# Patient Record
Sex: Female | Born: 1995 | Race: White | Hispanic: No | Marital: Single | Smoking: Never smoker
Health system: Southern US, Community
[De-identification: ages and names within clinical notes are randomized; demographics above are authoritative.]

## PROBLEM LIST (undated history)

## (undated) DIAGNOSIS — E0500 Thyrotoxicosis with diffuse goiter without thyrotoxic crisis or storm: Secondary | ICD-10-CM

## (undated) HISTORY — PX: ADENOIDECTOMY: SUR15

## (undated) HISTORY — PX: TONSILLECTOMY: SUR1361

---

## 2020-08-12 ENCOUNTER — Other Ambulatory Visit: Payer: Self-pay

## 2020-08-12 ENCOUNTER — Ambulatory Visit
Admission: EM | Admit: 2020-08-12 | Discharge: 2020-08-12 | Disposition: A | Discharge status: Home / Self Care | Payer: PRIVATE HEALTH INSURANCE | Attending: Emergency Medicine | Admitting: Emergency Medicine

## 2020-08-12 DIAGNOSIS — Z1152 Encounter for screening for COVID-19: Secondary | ICD-10-CM | POA: Diagnosis not present

## 2020-08-12 DIAGNOSIS — J029 Acute pharyngitis, unspecified: Secondary | ICD-10-CM | POA: Insufficient documentation

## 2020-08-12 LAB — POCT RAPID STREP A (OFFICE): Rapid Strep A Screen: NEGATIVE

## 2020-08-12 MED ORDER — PREDNISONE 10 MG PO TABS: 20.0000 mg | ORAL_TABLET | Freq: Every day | ORAL | 0 refills | Status: DC

## 2020-08-12 NOTE — Discharge Instructions (Addendum)
COVID-19 test will take 2 to 7 days for results to return someone will call you if your result is positive  Strep test negative, will send out for culture and we will call you with results Get plenty of rest and push fluids Continue to take Flonase and Zyrtec and Cepacol lozenges Prednisone was prescribed Take OTC ibuprofen or tylenol as needed for pain Follow up with PCP if symptoms persists Return or go to ER if patient has any new or worsening symptoms such as fever, chills, nausea, vomiting, worsening sore throat, cough, abdominal pain, chest pain, changes in bowel or bladder habits, etc..Marland Kitchen

## 2020-08-12 NOTE — ED Provider Notes (Signed)
[facility]   [id number] 08/12/20 Arrival Time: 1730  TK:[id number] THROAT  SUBJECTIVE: History from: patient.  Paula Holden is a 24 y.o. female who presents with abrupt onset of sore throat for the past 3 days.  Denies sick exposure to strep, flu or mono, or precipitating event.  Has tried OTC medication without relief.  Symptoms are made worse with swallowing, but tolerating liquids and own secretions without difficulty.  R denies previous symptoms in the past.   Denies fever, chills, fatigue, ear pain, sinus pain, rhinorrhea, nasal congestion, cough, SOB, wheezing, chest pain, nausea, rash, changes in bowel or bladder habits.     ROS: As per HPI.  All other pertinent ROS negative.     History reviewed. No pertinent past medical history. Past Surgical History:  Procedure Laterality Date   TONSILLECTOMY     Allergies  Allergen Reactions   Augmentin [Amoxicillin-Pot Clavulanate] Nausea And Vomiting   No current facility-administered medications on file prior to encounter.   Current Outpatient Medications on File Prior to Encounter  Medication Sig Dispense Refill   TRI-ESTARYLLA 0.18/0.215/0.25 MG-35 MCG tablet Take 1 tablet by mouth daily.     Social History   Socioeconomic History   Marital status: Single    Spouse name: Not on file   Number of children: Not on file   Years of education: Not on file   Highest education level: Not on file  Occupational History   Not on file  Tobacco Use   Smoking status: Never Smoker   Smokeless tobacco: Never Used  Substance and Sexual Activity   Alcohol use: Yes   Drug use: Not on file   Sexual activity: Not on file  Other Topics Concern   Not on file  Social History Narrative   Not on file   Social Determinants of Health   Financial Resource Strain:    Difficulty of Paying Living Expenses: Not on file  Food Insecurity:    Worried About Running Out of Food in the Last Year: Not on file   Ran Out of  Food in the Last Year: Not on file  Transportation Needs:    Lack of Transportation (Medical): Not on file   Lack of Transportation (Non-Medical): Not on file  Physical Activity:    Days of Exercise per Week: Not on file   Minutes of Exercise per Session: Not on file  Stress:    Feeling of Stress : Not on file  Social Connections:    Frequency of Communication with Friends and Family: Not on file   Frequency of Social Gatherings with Friends and Family: Not on file   Attends Religious Services: Not on file   Active Member of Clubs or Organizations: Not on file   Attends Banker Meetings: Not on file   Marital Status: Not on file  Intimate Partner Violence:    Fear of Current or Ex-Partner: Not on file   Emotionally Abused: Not on file   Physically Abused: Not on file   Sexually Abused: Not on file   History reviewed. No pertinent family history.  OBJECTIVE:  Vitals:   08/12/20 1845  BP: 128/86  Pulse: 73  Resp: 18  Temp: 98 F (36.7 C)  SpO2: 99%     General appearance: alert; appears fatigued, but nontoxic, speaking in full sentences and managing own secretions HEENT: NCAT; Ears: EACs clear, TMs pearly gray with visible cone of light, without erythema; Eyes: PERRL, EOMI grossly; Nose: no obvious rhinorrhea; Throat:  oropharynx clear, tonsils 1+ and mildly erythematous without white tonsillar exudates, uvula midline Neck: supple without LAD Lungs: CTA bilaterally without adventitious breath sounds; cough absent Heart: regular rate and rhythm.  Radial pulses 2+ symmetrical bilaterally Skin: warm and dry Psychological: alert and cooperative; normal mood and affect  LABS: Results for orders placed or performed during the hospital encounter of 08/12/20 (from the past 24 hour(s))  POCT rapid strep A     Status: None   Collection Time: 08/12/20  7:00 PM  Result Value Ref Range   Rapid Strep A Screen Negative Negative     ASSESSMENT &  PLAN:  1. Sore throat   2. Encounter for screening for COVID-19     Meds ordered this encounter  Medications   predniSONE (DELTASONE) 10 MG tablet    Sig: Take 2 tablets (20 mg total) by mouth daily.    Dispense:  15 tablet    Refill:  0    Discharge instructions  COVID-19 test will take 2 to 7 days for results to return someone will call you if your result is positive  Strep test negative, will send out for culture and we will call you with results Get plenty of rest and push fluids Continue to take Flonase and Zyrtec and Cepacol lozenges Prednisone was prescribed Take OTC ibuprofen or tylenol as needed for pain Follow up with PCP if symptoms persists Return or go to ER if patient has any new or worsening symptoms such as fever, chills, nausea, vomiting, worsening sore throat, cough, abdominal pain, chest pain, changes in bowel or bladder habits, etc...  Reviewed expectations re: course of current medical issues. Questions answered. Outlined signs and symptoms indicating need for more acute intervention. Patient verbalized understanding. After Visit Summary given.     Note: This document was prepared using Dragon voice recognition software and may include unintentional dictation errors.    Durward Parcel, FNP 08/12/20 1913

## 2020-08-12 NOTE — ED Triage Notes (Signed)
Pt presents with c/o sore throat for past 3 days

## 2020-08-13 ENCOUNTER — Telehealth: Payer: Self-pay | Admitting: Emergency Medicine

## 2020-08-13 LAB — NOVEL CORONAVIRUS, NAA

## 2020-08-13 NOTE — Telephone Encounter (Signed)
Received a call from [facility] that patient's sample will not be run due to it being mislabeled with another patient's name.  Attempted to call patient to let her know, LVM.  Asked her to return call to me, or to the location where she was seen.

## 2020-08-14 ENCOUNTER — Ambulatory Visit
Admission: EM | Admit: 2020-08-14 | Discharge: 2020-08-14 | Disposition: A | Discharge status: Home / Self Care | Payer: PRIVATE HEALTH INSURANCE | Attending: Emergency Medicine | Admitting: Emergency Medicine

## 2020-08-14 ENCOUNTER — Other Ambulatory Visit: Payer: Self-pay

## 2020-08-14 ENCOUNTER — Telehealth: Payer: Self-pay | Admitting: Emergency Medicine

## 2020-08-14 DIAGNOSIS — Z1152 Encounter for screening for COVID-19: Secondary | ICD-10-CM

## 2020-08-14 MED ORDER — LIDOCAINE VISCOUS HCL 2 % MT SOLN: 15.0000 mL | OROMUCOSAL | 0 refills | Status: DC | PRN

## 2020-08-14 NOTE — ED Triage Notes (Signed)
Pt here for re collection of covid test

## 2020-08-14 NOTE — Telephone Encounter (Signed)
Sent to [facility]

## 2020-08-15 LAB — CULTURE, GROUP A STREP (THRC)

## 2020-08-15 LAB — NOVEL CORONAVIRUS, NAA: SARS-CoV-2, NAA: NOT DETECTED

## 2021-03-04 LAB — TSH: TSH: 0.15 — AB (ref 0.41–5.90)

## 2021-03-09 ENCOUNTER — Encounter: Payer: Self-pay | Admitting: Nurse Practitioner

## 2021-03-09 ENCOUNTER — Ambulatory Visit (INDEPENDENT_AMBULATORY_CARE_PROVIDER_SITE_OTHER): Payer: 59 | Admitting: Nurse Practitioner

## 2021-03-09 ENCOUNTER — Other Ambulatory Visit: Payer: Self-pay

## 2021-03-09 VITALS — BP 131/85 | HR 93 | Ht 68.0 in | Wt 175.0 lb

## 2021-03-09 DIAGNOSIS — R7989 Other specified abnormal findings of blood chemistry: Secondary | ICD-10-CM | POA: Diagnosis not present

## 2021-03-09 NOTE — Progress Notes (Signed)
03/09/2021     Endocrinology Consult Note    Subjective:    Patient ID: Paula Holden, female    DOB: [birth year], PCP Paula Chime, Paula Holden.   History reviewed. No pertinent past medical history.  Past Surgical History:  Procedure Laterality Date  . ADENOIDECTOMY    . TONSILLECTOMY      Social History   Socioeconomic History  . Marital status: Single    Spouse name: Not on file  . Number of children: Not on file  . Years of education: Not on file  . Highest education level: Not on file  Occupational History  . Not on file  Tobacco Use  . Smoking status: Never Smoker  . Smokeless tobacco: Never Used  Vaping Use  . Vaping Use: Never used  Substance and Sexual Activity  . Alcohol use: Yes    Comment: Social  . Drug use: Never  . Sexual activity: Not on file  Other Topics Concern  . Not on file  Social History Narrative  . Not on file   Social Determinants of Health   Financial Resource Strain: Not on file  Food Insecurity: Not on file  Transportation Needs: Not on file  Physical Activity: Not on file  Stress: Not on file  Social Connections: Not on file    Family History  Problem Relation Age of Onset  . Hyperlipidemia Mother   . Thyroid disease Maternal Grandmother     Outpatient Encounter Medications as of 03/09/2021  Medication Sig  . hydrOXYzine (ATARAX/VISTARIL) 10 MG tablet Take by mouth daily.  . Norgestimate-Ethinyl Estradiol Triphasic (ORTHO TRI-CYCLEN LO) 0.18/0.215/0.25 MG-25 MCG tab Take 1 tablet by mouth daily.  Marland Kitchen buPROPion (WELLBUTRIN SR) 100 MG 12 hr tablet Take 100 mg by mouth every morning.  . busPIRone (BUSPAR) 10 MG tablet Take 10 mg by mouth 2 (two) times daily.  Marland Kitchen ibuprofen (ADVIL) 800 MG tablet Take 800 mg by mouth 2 (two) times daily as needed.  . sertraline (ZOLOFT) 50 MG tablet Take 50 mg by mouth daily.  . [DISCONTINUED] hydrOXYzine (ATARAX/VISTARIL) 10 MG tablet Take 10-20 mg by mouth at bedtime.  . [DISCONTINUED]  lidocaine (XYLOCAINE) 2 % solution Use as directed 15 mLs in the mouth or throat as needed for mouth pain (Do NOT exceed 8 doses in a 24 hour period).  . [DISCONTINUED] predniSONE (DELTASONE) 10 MG tablet Take 2 tablets (20 mg total) by mouth daily.  . [DISCONTINUED] sertraline (ZOLOFT) 25 MG tablet Take 25 mg by mouth daily.  . [DISCONTINUED] TRI-ESTARYLLA 0.18/0.215/0.25 MG-35 MCG tablet Take 1 tablet by mouth daily.   No facility-administered encounter medications on file as of 03/09/2021.    ALLERGIES: Allergies  Allergen Reactions  . Augmentin [Amoxicillin-Pot Clavulanate] Nausea And Vomiting  . Prednisone Anxiety    VACCINATION STATUS:  There is no immunization history on file for this patient.   HPI Thyroid Problem Presents for initial visit. The condition has lasted for 3 months. Symptoms include anxiety, fatigue, palpitations and tremors. The symptoms have been stable. Past treatments include nothing. Risk factors include family history of hyperthyroidism and family history of hypothyroidism.     Paula Holden is 25 y.o. female who presents today with a medical history as above. she is being seen in consultation for hyperthyroidism requested by Paula Chime, Paula Holden.  she has been dealing with symptoms of anxiety, fatigue, palpitations, headache, near syncope, insomnia, throat tenderness, and tremors for 2-3 months. These symptoms are progressively worsening and  troubling to her.  her most recent thyroid labs revealed suppressed TSH of 0.145 and elevated FT4 of 2.4 on 03/04/21. she denies dysphagia, choking, shortness of breath, no recent voice change.    she does have family history of thyroid dysfunction in her cousin (Graves disease), maternal grandmother (Hypo), and great aunt (unknown), but denies family hx of thyroid cancer. she denies personal history of goiter. she is not on any anti-thyroid medications nor on any thyroid hormone supplements. She does endorse use of Biotin  containing supplements.  she is willing to proceed with appropriate work up and therapy for thyrotoxicosis.   Review of systems  Constitutional: + Minimally fluctuating body weight, current Body mass index is 26.61 kg/m., + fatigue, no subjective hyperthermia, no subjective hypothermia Eyes: no blurry vision, no xerophthalmia ENT: + sore throat (improving), no nodules palpated in throat, no dysphagia/odynophagia, no hoarseness Cardiovascular: no chest pain, no shortness of breath, + palpitations, no leg swelling Respiratory: no cough, no shortness of breath Gastrointestinal: no nausea/vomiting/diarrhea Musculoskeletal: no muscle/joint aches Skin: no rashes, no hyperemia Neurological: + tremors, no numbness, no tingling, no dizziness Psychiatric: no depression, + anxiety   Objective:    BP 131/85   Pulse 93   Ht 5\' 8"  (1.727 m)   Wt 175 lb (79.4 kg)   BMI 26.61 kg/m   Wt Readings from Last 3 Encounters:  03/09/21 175 lb (79.4 kg)     BP Readings from Last 3 Encounters:  03/09/21 131/85  08/12/20 128/86                          Physical Exam- Limited  Constitutional:  Body mass index is 26.61 kg/m. , not in acute distress, anxious state of mind Eyes:  EOMI, no exophthalmos Neck: Supple, visible fullness to anterior neck Thyroid: + gross goiter, tenderness to palpation R>L Cardiovascular: RRR, no murmers, rubs, or gallops, no edema Respiratory: Adequate breathing efforts, no crackles, rales, rhonchi, or wheezing Musculoskeletal: no gross deformities, strength intact in all four extremities, no gross restriction of joint movements Skin:  no rashes, no hyperemia Neurological: + tremor with outstretched hands, deep tendon reflexes normal BLE   CMP  No results found for: NA, K, CL, CO2, GLUCOSE, BUN, CREATININE, CALCIUM, PROT, ALBUMIN, AST, ALT, ALKPHOS, BILITOT, GFRNONAA, GFRAA   CBC No results found for: WBC, RBC, HGB, HCT, PLT, MCV, MCH, MCHC, RDW, LYMPHSABS,  MONOABS, EOSABS, BASOSABS   Diabetic Labs (most recent): No results found for: HGBA1C  Lipid Panel  No results found for: CHOL, TRIG, HDL, CHOLHDL, VLDL, LDLCALC, LDLDIRECT, LABVLDL   Lab Results  Component Value Date   TSH 0.15 (A) 03/04/2021        Assessment & Plan:   1. Abnormal TSH- suspect hyperthyroidism r/t Graves Disease  she is being seen at a kind request of [facility], 03/06/2021, Paula Holden.  her history and most recent labs are reviewed, and she was examined clinically. Subjective and objective findings are consistent with thyrotoxicosis likely from primary hyperthyroidism. The potential risks of untreated thyrotoxicosis and the need for definitive therapy have been discussed in detail with her, and she agrees to proceed with diagnostic workup and treatment plan.   I will repeat full profile thyroid function tests after she has been off Biotin supplement for approximately 1 week, and confirmatory thyroid uptake and scan will be scheduled to be done as soon as possible.   Options of therapy are discussed with her.  We  discussed the option of treating it with medications including methimazole or PTU which may have side effects including rash, transaminitis, and bone marrow suppression.  We also discussed the option of definitive therapy with RAI ablation of the thyroid. If she is found to have primary hyperthyroidism from Graves' disease , toxic multinodular goiter or toxic nodular goiter the preferred modality of treatment would be I-131 thyroid ablation. Surgery is another choice of treatment in some cases, in her case surgery is not a good fit for presentation with only mild goiter.  -Patient is made aware of the high likelihood of post ablative hypothyroidism with subsequent need for lifelong thyroid hormone replacement. sheunderstands this outcome and she is  willing to proceed.      she will return in 10 days for treatment decision.  Given her stable symptoms and stable HRof 93  today, therefore no need for beta blocker today.  She has allergy to prednisone.    -Patient is advised to maintain close follow up with Paula Chime, Paula Holden for primary care needs.   - Time spent with the patient: 60 minutes, of which >50% was spent in obtaining information about her symptoms, reviewing her previous labs, evaluations, and treatments, counseling her about her hyperthyroidism , and developing a plan to confirm the diagnosis and long term treatment as necessary. Please refer to "Patient Self Inventory" in the Media tab for reviewed elements of pertinent patient history.  [facility] participated in the discussions, expressed understanding, and voiced agreement with the above plans.  All questions were answered to her satisfaction. she is encouraged to contact clinic should she have any questions or concerns prior to her return visit.   Follow up plan: Return in about 10 days (around 03/19/2021) for Thyroid follow up, Previsit labs, uptake and scan.   Thank you for involving me in the care of this pleasant patient, and I will continue to update you with her progress.  Ronny Bacon, [facility] [address] [place], [place] [address] Phone: [phone] Fax: [phone]  03/09/2021, 9:44 AM

## 2021-03-09 NOTE — Patient Instructions (Signed)
Thyroid-Stimulating Hormone Test Why am I having this test? The thyroid is a gland in the lower front of the neck. It makes hormones that affect many body parts and systems, including the system that affects how quickly the body burns fuel for energy (metabolism). The pituitary gland is located just below the brain, behind the eyes and nasal passages. It helps maintain thyroid hormone levels and thyroid gland function. You may have a thyroid-stimulating hormone (TSH) test if you have possible symptoms of abnormal thyroid hormone levels. This test can help your health care provider:  Diagnose a disorder of the thyroid gland or pituitary gland.  Manage your condition and treatment if you have an underactive thyroid (hypothyroidism) or an overactive thyroid (hyperthyroidism). Newborn babies may have this test done to screen for hypothyroidism that is present at birth (congenital). What is being tested? This test measures the amount of TSH in your blood. TSH may also be called thyrotropin. When the thyroid does not make enough hormones, the pituitary gland releases TSH into the bloodstream to stimulate the thyroid gland to make more hormones. What kind of sample is taken? A blood sample is required for this test. It is usually collected by inserting a needle into a blood vessel. For newborns, a small amount of blood may be collected from the umbilical cord, or by using a small needle to prick the baby's heel (heel stick).      Tell a health care provider about:  All medicines you are taking, including vitamins, herbs, eye drops, creams, and over-the-counter medicines.  Any blood disorders you have.  Any surgeries you have had.  Any medical conditions you have.  Whether you are pregnant or may be pregnant. How are the results reported? Your test results will be reported as a value that indicates how much TSH is in your blood. Your health care provider will compare your results to normal ranges  that were established after testing a large group of people (reference ranges). Reference ranges may vary among labs and hospitals. For this test, common reference ranges are:  Adult: 2-10 microunits/mL or 2-10 milliunits/L.  Newborn: ? Heel stick: 3-18 microunits/mL or 3-18 milliunits/L. ? Umbilical cord: 3-12 microunits/mL or 3-12 milliunits/L. What do the results mean? Results that are within the reference range are considered normal. This means that you have a normal amount of TSH in your blood. Results that are higher than the reference range mean that your TSH levels are too high. This may mean:  Your thyroid gland is not making enough thyroid hormones.  Your thyroid medicine dosage is too low.  You have a tumor on your pituitary gland. This is rare. Results that are lower than the reference range mean that your TSH levels are too low. This may be caused by hyperthyroidism or by a problem with the pituitary gland function. Talk with your health care provider about what your results mean. Questions to ask your health care provider Ask your health care provider, or the department that is doing the test:  When will my results be ready?  How will I get my results?  What are my treatment options?  What other tests do I need?  What are my next steps? Summary  You may have a thyroid-stimulating hormone (TSH) test if you have possible symptoms of abnormal thyroid hormone levels.  The thyroid is a gland in the lower front of the neck. It makes hormones that affect many body parts and systems.  The pituitary gland is   located just below the brain, behind the eyes and nasal passages. It helps maintain thyroid hormone levels and thyroid gland function.  This test measures the amount of TSH in your blood. TSH is made by the pituitary gland. It may also be called thyrotropin. This information is not intended to replace advice given to you by your health care provider. Make sure you  discuss any questions you have with your health care provider. Document Revised: 08/13/2020 Document Reviewed: 08/13/2020 Elsevier Patient Education  2021 [employer]  

## 2021-03-18 ENCOUNTER — Encounter (HOSPITAL_COMMUNITY)
Admission: RE | Admit: 2021-03-18 | Discharge: 2021-03-18 | Disposition: A | Discharge status: Home / Self Care | Payer: 59 | Source: Ambulatory Visit | Attending: Nurse Practitioner | Admitting: Nurse Practitioner

## 2021-03-18 ENCOUNTER — Encounter (HOSPITAL_COMMUNITY): Payer: Self-pay

## 2021-03-18 DIAGNOSIS — R7989 Other specified abnormal findings of blood chemistry: Secondary | ICD-10-CM | POA: Insufficient documentation

## 2021-03-18 MED ORDER — SODIUM IODIDE I-123 7.4 MBQ CAPS
304.0000 | ORAL_CAPSULE | Freq: Once | ORAL | Status: AC
  Administered 2021-03-18: 304 via ORAL

## 2021-03-19 ENCOUNTER — Encounter (HOSPITAL_COMMUNITY)
Admission: RE | Admit: 2021-03-19 | Discharge: 2021-03-19 | Disposition: A | Discharge status: Home / Self Care | Payer: 59 | Source: Ambulatory Visit | Attending: Nurse Practitioner | Admitting: Nurse Practitioner

## 2021-03-20 LAB — T4, FREE: Free T4: 0.99 ng/dL (ref 0.82–1.77)

## 2021-03-20 LAB — TSH: TSH: 2.26 u[IU]/mL (ref 0.450–4.500)

## 2021-03-20 LAB — THYROGLOBULIN ANTIBODY: Thyroglobulin Antibody: 3.8 IU/mL — ABNORMAL HIGH (ref 0.0–0.9)

## 2021-03-20 LAB — THYROID PEROXIDASE ANTIBODY: Thyroperoxidase Ab SerPl-aCnc: 35 IU/mL — ABNORMAL HIGH (ref 0–34)

## 2021-03-22 NOTE — Patient Instructions (Signed)

## 2021-03-23 ENCOUNTER — Ambulatory Visit (INDEPENDENT_AMBULATORY_CARE_PROVIDER_SITE_OTHER): Payer: 59 | Admitting: Nurse Practitioner

## 2021-03-23 ENCOUNTER — Other Ambulatory Visit: Payer: Self-pay

## 2021-03-23 ENCOUNTER — Encounter: Payer: Self-pay | Admitting: Nurse Practitioner

## 2021-03-23 VITALS — BP 112/79 | HR 92 | Ht 68.0 in | Wt 178.6 lb

## 2021-03-23 DIAGNOSIS — E0590 Thyrotoxicosis, unspecified without thyrotoxic crisis or storm: Secondary | ICD-10-CM

## 2021-03-23 DIAGNOSIS — E0500 Thyrotoxicosis with diffuse goiter without thyrotoxic crisis or storm: Secondary | ICD-10-CM

## 2021-03-23 NOTE — Progress Notes (Signed)
03/23/2021     Endocrinology Follow Up Note    Subjective:    Patient ID: Paula Holden, female    DOB: 08-06-1996, PCP Lenoria Chime, FNP.   History reviewed. No pertinent past medical history.  Past Surgical History:  Procedure Laterality Date  . ADENOIDECTOMY    . TONSILLECTOMY      Social History   Socioeconomic History  . Marital status: Single    Spouse name: Not on file  . Number of children: Not on file  . Years of education: Not on file  . Highest education level: Not on file  Occupational History  . Not on file  Tobacco Use  . Smoking status: Never Smoker  . Smokeless tobacco: Never Used  Vaping Use  . Vaping Use: Never used  Substance and Sexual Activity  . Alcohol use: Yes    Comment: Social  . Drug use: Never  . Sexual activity: Not on file  Other Topics Concern  . Not on file  Social History Narrative  . Not on file   Social Determinants of Health   Financial Resource Strain: Not on file  Food Insecurity: Not on file  Transportation Needs: Not on file  Physical Activity: Not on file  Stress: Not on file  Social Connections: Not on file    Family History  Problem Relation Age of Onset  . Hyperlipidemia Mother   . Thyroid disease Maternal Grandmother     Outpatient Encounter Medications as of 03/23/2021  Medication Sig  . buPROPion (WELLBUTRIN SR) 100 MG 12 hr tablet Take 100 mg by mouth every morning.  . busPIRone (BUSPAR) 10 MG tablet Take 10 mg by mouth 2 (two) times daily.  . fluticasone (FLONASE) 50 MCG/ACT nasal spray Place into both nostrils.  . hydrOXYzine (ATARAX/VISTARIL) 10 MG tablet Take 20 mg by mouth at bedtime.  Marland Kitchen ibuprofen (ADVIL) 800 MG tablet Take 800 mg by mouth 2 (two) times daily as needed.  . loratadine (CLARITIN) 10 MG tablet Take 10 mg by mouth daily.  . montelukast (SINGULAIR) 10 MG tablet Take 1 tablet by mouth daily.  . Norgestimate-Ethinyl Estradiol Triphasic 0.18/0.215/0.25 MG-25 MCG tab Take 1  tablet by mouth daily.  . sertraline (ZOLOFT) 50 MG tablet Take 50 mg by mouth daily.   No facility-administered encounter medications on file as of 03/23/2021.    ALLERGIES: Allergies  Allergen Reactions  . Augmentin [Amoxicillin-Pot Clavulanate] Nausea And Vomiting  . Prednisone Anxiety    VACCINATION STATUS:  There is no immunization history on file for this patient.   HPI Thyroid Problem Presents for follow-up visit. The condition has lasted for 3 months. Symptoms include anxiety, fatigue, palpitations and tremors. The symptoms have been stable. Past treatments include nothing. Risk factors include family history of hyperthyroidism and family history of hypothyroidism.     Paula Holden is 25 y.o. female who presents today with a medical history as above. she is being seen in follow up after being seen in consultation for hyperthyroidism requested by Lenoria Chime, FNP.  she has been dealing with symptoms of anxiety, fatigue, palpitations, headache, near syncope, insomnia, throat tenderness, and tremors for 2-3 months. These symptoms are progressively worsening and troubling to her.  her most recent thyroid labs revealed suppressed TSH of 0.145 and elevated FT4 of 2.4 on 03/04/21. she denies dysphagia, choking, shortness of breath, no recent voice change.    she does have family history of thyroid dysfunction in her cousin (Graves disease),  maternal grandmother (Hypo), and great aunt (unknown), but denies family hx of thyroid cancer. she denies personal history of goiter. she is not on any anti-thyroid medications nor on any thyroid hormone supplements. She does endorse use of Biotin containing supplements.  she is willing to proceed with appropriate work up and therapy for thyrotoxicosis.   Review of systems  Constitutional: + Minimally fluctuating body weight, current Body mass index is 27.16 kg/m., + fatigue, no subjective hyperthermia, no subjective hypothermia Eyes: no  blurry vision, no xerophthalmia ENT: + sore throat (improving), no nodules palpated in throat, no dysphagia/odynophagia, no hoarseness Cardiovascular: no chest pain, no shortness of breath, + palpitations, no leg swelling Respiratory: no cough, no shortness of breath Gastrointestinal: no nausea/vomiting/diarrhea Musculoskeletal: no muscle/joint aches Skin: no rashes, no hyperemia Neurological: + tremors, no numbness, no tingling, no dizziness Psychiatric: no depression, + anxiety   Objective:    BP 112/79 (BP Location: Left Arm, Patient Position: Sitting)   Pulse 92   Ht 5\' 8"  (1.727 m)   Wt 178 lb 9.6 oz (81 kg)   LMP 03/13/2021 (Exact Date)   BMI 27.16 kg/m   Wt Readings from Last 3 Encounters:  03/23/21 178 lb 9.6 oz (81 kg)  03/09/21 175 lb (79.4 kg)     BP Readings from Last 3 Encounters:  03/23/21 112/79  03/09/21 131/85  08/12/20 128/86                        Physical Exam- Limited  Constitutional:  There is no height or weight on file to calculate BMI. , not in acute distress, anxious state of mind Eyes:  EOMI, no exophthalmos Neck: Supple, visible fullness to anterior neck Thyroid: + gross goiter Cardiovascular: RRR, no murmers, rubs, or gallops, no edema Respiratory: Adequate breathing efforts, no crackles, rales, rhonchi, or wheezing Musculoskeletal: no gross deformities, strength intact in all four extremities, no gross restriction of joint movements Skin:  no rashes, no hyperemia Neurological: + tremor with outstretched hands, deep tendon reflexes normal BLE   CMP  No results found for: NA, K, CL, CO2, GLUCOSE, BUN, CREATININE, CALCIUM, PROT, ALBUMIN, AST, ALT, ALKPHOS, BILITOT, GFRNONAA, GFRAA   CBC No results found for: WBC, RBC, HGB, HCT, PLT, MCV, MCH, MCHC, RDW, LYMPHSABS, MONOABS, EOSABS, BASOSABS   Diabetic Labs (most recent): No results found for: HGBA1C  Lipid Panel  No results found for: CHOL, TRIG, HDL, CHOLHDL, VLDL, LDLCALC,  LDLDIRECT, LABVLDL   Lab Results  Component Value Date   TSH 2.260 03/19/2021   TSH 0.15 (A) 03/04/2021   FREET4 0.99 03/19/2021     Results for ANETRA, CZERWINSKI (MRN Paula Holden) as of 03/23/2021 08:19  Ref. Range 03/04/2021 00:00 03/19/2021 09:03 03/19/2021 09:32  TSH Latest Ref Range: 0.450 - 4.500 uIU/mL 0.15 (A)  2.260  T4,Free(Direct) Latest Ref Range: 0.82 - 1.77 ng/dL   05/19/2021  Thyroperoxidase Ab SerPl-aCnc Latest Ref Range: 0 - 34 IU/mL   35 (H)  Thyroglobulin Antibody Latest Ref Range: 0.0 - 0.9 IU/mL   3.8 (H)   Uptake and scan 03/19/21  CLINICAL DATA:  Suppressed TSH suspicious for hyperthyroidism, neck swelling and tenderness, difficulty swallowing, heat intolerance, difficulty sleeping, hand tremors, irritability and mood venous, increased perspiration, heart palpitations, weakness, dry skin  EXAM: THYROID SCAN AND UPTAKE - 4 AND 24 HOURS  TECHNIQUE: Following oral administration of I-123 capsule, anterior planar imaging was acquired at 24 hours. Thyroid uptake was calculated with a thyroid probe at  4-6 hours and 24 hours.  RADIOPHARMACEUTICALS:  304 uCi I-123 sodium iodide p.o.  COMPARISON:  None  FINDINGS: Homogeneous tracer distribution in both thyroid lobes.  No focal areas of increased or decreased tracer localization seen.  4 hour I-123 uptake = 23.5% (normal 5-20%)  24 hour I-123 uptake = 43.4% (normal 10-30%)  IMPRESSION: Normal thyroid scan.  Elevated 4 hour and 24 hour radio iodine uptakes consistent with hyperthyroidism.  Overall findings consistent with Graves disease.   Electronically Signed   By: Ulyses Southward M.D.   On: 03/19/2021 12:56  Assessment & Plan:   1. Hyperthyroidism r/t Graves Disease:   she is being seen at a kind request of White, Raenette Rover, FNP.  Her repeat thyroid labs show positive TPO and thyroglobulin antibodies, indicating autoimmune etiology.  Her uptake and scan was also suggestive of Graves disease with  4-hr uptake 23.5% and 24-hr uptake of 43.4%.  - We discussed the preferred treatment of definitive therapy with RAI ablation of the thyroid.  Patient is made aware of the high likelihood of post ablative hypothyroidism with subsequent need for lifelong thyroid hormone replacement. she understands this outcome and she is willing to proceed.      -Will recheck thyroid hormones 6-8 weeks post RAI therapy to assess response to treatment and to help indicate when appropriate to start thyroid hormone replacement therapy.    -Patient is advised to maintain close follow up with Lenoria Chime, FNP for primary care needs.    I spent 30 minutes in the care of the patient today including review of labs from Thyroid Function, CMP, and other relevant labs ; imaging/biopsy records (current and previous including abstractions from other facilities); face-to-face time discussing  her lab results and symptoms, medications doses, her options of short and long term treatment based on the latest standards of care / guidelines;   and documenting the encounter.  [facility] participated in the discussions, expressed understanding, and voiced agreement with the above plans.  All questions were answered to her satisfaction. she is encouraged to contact clinic should she have any questions or concerns prior to her return visit.    Follow up plan: Return in about 7 weeks (around 05/11/2021) for Thyroid follow up, Previsit labs- RAI ablation.   Thank you for involving me in the care of this pleasant patient, and I will continue to update you with her progress.  Ronny Bacon, [facility] [address] [place], [place] [address] Phone: [phone] Fax: [phone]  03/23/2021, 9:37 AM

## 2021-04-01 ENCOUNTER — Encounter (HOSPITAL_COMMUNITY)
Admission: RE | Admit: 2021-04-01 | Discharge: 2021-04-01 | Disposition: A | Discharge status: Home / Self Care | Payer: 59 | Source: Ambulatory Visit | Attending: Nurse Practitioner | Admitting: Nurse Practitioner

## 2021-04-01 ENCOUNTER — Encounter (HOSPITAL_COMMUNITY): Payer: Self-pay

## 2021-04-01 ENCOUNTER — Other Ambulatory Visit (HOSPITAL_COMMUNITY)
Admission: RE | Admit: 2021-04-01 | Discharge: 2021-04-01 | Disposition: A | Discharge status: Home / Self Care | Payer: 59 | Source: Ambulatory Visit | Attending: Nurse Practitioner | Admitting: Nurse Practitioner

## 2021-04-01 ENCOUNTER — Other Ambulatory Visit: Payer: Self-pay

## 2021-04-01 DIAGNOSIS — E0500 Thyrotoxicosis with diffuse goiter without thyrotoxic crisis or storm: Secondary | ICD-10-CM | POA: Insufficient documentation

## 2021-04-01 LAB — HCG, SERUM, QUALITATIVE: Preg, Serum: NEGATIVE

## 2021-04-01 LAB — TSH: TSH: 2.595 u[IU]/mL (ref 0.350–4.500)

## 2021-04-01 LAB — T4, FREE: Free T4: 0.66 ng/dL (ref 0.61–1.12)

## 2021-04-01 MED ORDER — SODIUM IODIDE I 131 CAPSULE
18.0000 | Freq: Once | INTRAVENOUS | Status: AC | PRN
  Administered 2021-04-01: 18.3 via ORAL

## 2021-05-05 ENCOUNTER — Other Ambulatory Visit: Payer: Self-pay

## 2021-05-05 DIAGNOSIS — E0590 Thyrotoxicosis, unspecified without thyrotoxic crisis or storm: Secondary | ICD-10-CM

## 2021-05-05 NOTE — Progress Notes (Signed)
TS

## 2021-05-08 LAB — TSH: TSH: 0.267 u[IU]/mL — ABNORMAL LOW (ref 0.450–4.500)

## 2021-05-08 LAB — T4, FREE: Free T4: 1.05 ng/dL (ref 0.82–1.77)

## 2021-05-12 ENCOUNTER — Telehealth: Payer: 59 | Admitting: Nurse Practitioner

## 2021-05-12 DIAGNOSIS — E0500 Thyrotoxicosis with diffuse goiter without thyrotoxic crisis or storm: Secondary | ICD-10-CM

## 2021-05-12 DIAGNOSIS — Z923 Personal history of irradiation: Secondary | ICD-10-CM

## 2021-05-17 ENCOUNTER — Telehealth (INDEPENDENT_AMBULATORY_CARE_PROVIDER_SITE_OTHER): Payer: 59 | Admitting: Nurse Practitioner

## 2021-05-17 ENCOUNTER — Encounter: Payer: Self-pay | Admitting: Nurse Practitioner

## 2021-05-17 DIAGNOSIS — E0500 Thyrotoxicosis with diffuse goiter without thyrotoxic crisis or storm: Secondary | ICD-10-CM | POA: Diagnosis not present

## 2021-05-17 DIAGNOSIS — E0590 Thyrotoxicosis, unspecified without thyrotoxic crisis or storm: Secondary | ICD-10-CM | POA: Diagnosis not present

## 2021-05-17 NOTE — Progress Notes (Signed)
05/17/2021     Endocrinology Follow Up Note    TELEHEALTH VISIT: The patient is being engaged in telehealth visit due to COVID-19.  This type of visit limits physical examination significantly, and thus is not preferable over face-to-face encounters.  I connected with  Paula Holden on 05/17/21 by a video enabled telemedicine application and verified that I am speaking with the correct person using two identifiers.   I discussed the limitations of evaluation and management by telemedicine. The patient expressed understanding and agreed to proceed.    The participants involved in this visit include: Dani Gobble, NP located at [facility] and Paula Holden  located at their personal residence listed.    Subjective:    Patient ID: Paula Holden, female    DOB: [birth year], PCP Lenoria Chime, FNP.   History reviewed. No pertinent past medical history.  Past Surgical History:  Procedure Laterality Date  . ADENOIDECTOMY    . TONSILLECTOMY      Social History   Socioeconomic History  . Marital status: Single    Spouse name: Not on file  . Number of children: Not on file  . Years of education: Not on file  . Highest education level: Not on file  Occupational History  . Not on file  Tobacco Use  . Smoking status: Never Smoker  . Smokeless tobacco: Never Used  Vaping Use  . Vaping Use: Never used  Substance and Sexual Activity  . Alcohol use: Yes    Comment: Social  . Drug use: Never  . Sexual activity: Not on file  Other Topics Concern  . Not on file  Social History Narrative  . Not on file   Social Determinants of Health   Financial Resource Strain: Not on file  Food Insecurity: Not on file  Transportation Needs: Not on file  Physical Activity: Not on file  Stress: Not on file  Social Connections: Not on file    Family History  Problem Relation Age of Onset  . Hyperlipidemia Mother   . Thyroid disease Maternal  Grandmother     Outpatient Encounter Medications as of 05/17/2021  Medication Sig  . buPROPion (WELLBUTRIN SR) 100 MG 12 hr tablet Take 100 mg by mouth every morning.  . busPIRone (BUSPAR) 10 MG tablet Take 10 mg by mouth 2 (two) times daily.  . fluticasone (FLONASE) 50 MCG/ACT nasal spray Place into both nostrils.  . hydrOXYzine (ATARAX/VISTARIL) 10 MG tablet Take 20 mg by mouth at bedtime.  Marland Kitchen ibuprofen (ADVIL) 800 MG tablet Take 800 mg by mouth 2 (two) times daily as needed.  . loratadine (CLARITIN) 10 MG tablet Take 10 mg by mouth daily.  . montelukast (SINGULAIR) 10 MG tablet Take 1 tablet by mouth daily.  . Norgestimate-Ethinyl Estradiol Triphasic 0.18/0.215/0.25 MG-25 MCG tab Take 1 tablet by mouth daily.  . sertraline (ZOLOFT) 50 MG tablet Take 50 mg by mouth daily.  . traMADol (ULTRAM) 50 MG tablet Take 50 mg by mouth 3 (three) times daily.   No facility-administered encounter medications on file as of 05/17/2021.    ALLERGIES: Allergies  Allergen Reactions  . Augmentin [Amoxicillin-Pot Clavulanate] Nausea And Vomiting  . Prednisone Anxiety    VACCINATION STATUS:  There is no immunization history on file for this patient.   HPI Thyroid Problem Presents for follow-up visit. The condition has lasted for 3 months. Symptoms include anxiety, fatigue, palpitations and tremors. The symptoms have been improving. Past treatments include nothing. Risk  factors include family history of hyperthyroidism and family history of hypothyroidism.     Paula Holden is 25 y.o. female who presents today with a medical history as above. she is being seen in follow up after being seen in consultation for hyperthyroidism requested by Lenoria Chime, FNP.  she has been dealing with symptoms of anxiety, fatigue, palpitations, headache, near syncope, insomnia, throat tenderness, and tremors for 2-3 months. These symptoms are progressively worsening and troubling to her.  her most recent thyroid labs  revealed suppressed TSH of 0.145 and elevated FT4 of 2.4 on 03/04/21. she denies dysphagia, choking, shortness of breath, no recent voice change.    she does have family history of thyroid dysfunction in her cousin (Graves disease), maternal grandmother (Hypo), and great aunt (unknown), but denies family hx of thyroid cancer. she denies personal history of goiter. she is not on any anti-thyroid medications nor on any thyroid hormone supplements. She does endorse use of Biotin containing supplements.  she is willing to proceed with appropriate work up and therapy for thyrotoxicosis.   Review of systems  Constitutional: + Minimally fluctuating body weight, current There is no height or weight on file to calculate BMI., + fatigue, no subjective hyperthermia, no subjective hypothermia Eyes: no blurry vision, no xerophthalmia ENT: + sore throat (improving), no nodules palpated in throat, no dysphagia/odynophagia, no hoarseness Cardiovascular: no chest pain, no shortness of breath, + palpitations-improving, no leg swelling Respiratory: no cough, no shortness of breath Gastrointestinal: no nausea/vomiting/diarrhea Musculoskeletal: no muscle/joint aches Skin: no rashes, no hyperemia Neurological: + tremors-improving, no numbness, no tingling, no dizziness Psychiatric: no depression, + anxiety-improving   Objective:    There were no vitals taken for this visit.  Wt Readings from Last 3 Encounters:  03/23/21 178 lb 9.6 oz (81 kg)  03/09/21 175 lb (79.4 kg)     BP Readings from Last 3 Encounters:  03/23/21 112/79  03/09/21 131/85  08/12/20 128/86                        Physical Exam- Telehealth- significantly limited due to nature of visit  Constitutional: There is no height or weight on file to calculate BMI. , not in acute distress, normal state of mind Respiratory: Adequate breathing efforts   CMP  No results found for: NA, K, CL, CO2, GLUCOSE, BUN, CREATININE, CALCIUM, PROT,  ALBUMIN, AST, ALT, ALKPHOS, BILITOT, GFRNONAA, GFRAA   CBC No results found for: WBC, RBC, HGB, HCT, PLT, MCV, MCH, MCHC, RDW, LYMPHSABS, MONOABS, EOSABS, BASOSABS   Diabetic Labs (most recent): No results found for: HGBA1C  Lipid Panel  No results found for: CHOL, TRIG, HDL, CHOLHDL, VLDL, LDLCALC, LDLDIRECT, LABVLDL   Lab Results  Component Value Date   TSH 0.267 (L) 05/07/2021   TSH 2.595 04/01/2021   TSH 2.260 03/19/2021   TSH 0.15 (A) 03/04/2021   FREET4 1.05 05/07/2021   FREET4 0.66 04/01/2021   FREET4 0.99 03/19/2021     Results for ALLAN, BACIGALUPI (MRN [id number]) as of 03/23/2021 08:19  Ref. Range 03/04/2021 00:00 03/19/2021 09:03 03/19/2021 09:32  TSH Latest Ref Range: 0.450 - 4.500 uIU/mL 0.15 (A)  2.260  T4,Free(Direct) Latest Ref Range: 0.82 - 1.77 ng/dL   8.10  Thyroperoxidase Ab SerPl-aCnc Latest Ref Range: 0 - 34 IU/mL   35 (H)  Thyroglobulin Antibody Latest Ref Range: 0.0 - 0.9 IU/mL   3.8 (H)   Uptake and scan 03/19/21  CLINICAL DATA:  Suppressed TSH suspicious  for hyperthyroidism, neck swelling and tenderness, difficulty swallowing, heat intolerance, difficulty sleeping, hand tremors, irritability and mood venous, increased perspiration, heart palpitations, weakness, dry skin  EXAM: THYROID SCAN AND UPTAKE - 4 AND 24 HOURS  TECHNIQUE: Following oral administration of I-123 capsule, anterior planar imaging was acquired at 24 hours. Thyroid uptake was calculated with a thyroid probe at 4-6 hours and 24 hours.  RADIOPHARMACEUTICALS:  304 uCi I-123 sodium iodide p.o.  COMPARISON:  None  FINDINGS: Homogeneous tracer distribution in both thyroid lobes.  No focal areas of increased or decreased tracer localization seen.  4 hour I-123 uptake = 23.5% (normal 5-20%)  24 hour I-123 uptake = 43.4% (normal 10-30%)  IMPRESSION: Normal thyroid scan.  Elevated 4 hour and 24 hour radio iodine uptakes consistent with hyperthyroidism.  Overall  findings consistent with Graves disease.   Electronically Signed   By: Ulyses Southward M.D.   On: 03/19/2021 12:56  Assessment & Plan:   1. Hyperthyroidism r/t Graves Disease S/P RAI therapy on 04/01/21  she is being seen at a kind request of White, Raenette Rover, FNP.  She is status post RAI on 04/01/21.  She reports improvement in her symptoms but that they are still present.  Her repeat thyroid function tests show improvement in her thyroid function but is still consistent with some thyroid function at this time.  Will repeat her TFTs in 2 months to determine the time to start thyroid hormone replacement therapy.       -Patient is advised to maintain close follow up with Lenoria Chime, FNP for primary care needs.    I spent 20 minutes dedicated to the care of this patient on the date of this encounter to include pre-visit review of records, face-to-face time with the patient, and post visit ordering of testing.  [facility] participated in the discussions, expressed understanding, and voiced agreement with the above plans.  All questions were answered to her satisfaction. she is encouraged to contact clinic should she have any questions or concerns prior to her return visit.    Follow up plan: Return in about 2 months (around 07/17/2021) for Thyroid follow up, Previsit labs.   Thank you for involving me in the care of this pleasant patient, and I will continue to update you with her progress.  Ronny Bacon, [facility] [address] [place], [place] [address] Phone: [phone] Fax: [phone]  05/17/2021, 1:07 PM

## 2021-06-15 ENCOUNTER — Telehealth: Payer: Self-pay

## 2021-06-15 NOTE — Telephone Encounter (Signed)
Received paperwork from [employer] disability - sent to [facility] as well as [facility] Medical Records

## 2021-07-16 LAB — T3, FREE: T3, Free: 1.6 pg/mL — ABNORMAL LOW (ref 2.0–4.4)

## 2021-07-16 LAB — T4, FREE: Free T4: 0.37 ng/dL — ABNORMAL LOW (ref 0.82–1.77)

## 2021-07-16 LAB — TSH: TSH: 42.8 u[IU]/mL — ABNORMAL HIGH (ref 0.450–4.500)

## 2021-07-19 ENCOUNTER — Ambulatory Visit (INDEPENDENT_AMBULATORY_CARE_PROVIDER_SITE_OTHER): Payer: 59 | Admitting: Nurse Practitioner

## 2021-07-19 ENCOUNTER — Encounter: Payer: Self-pay | Admitting: Nurse Practitioner

## 2021-07-19 ENCOUNTER — Other Ambulatory Visit: Payer: Self-pay

## 2021-07-19 VITALS — BP 133/85 | HR 74 | Ht 68.0 in | Wt 195.0 lb

## 2021-07-19 DIAGNOSIS — E890 Postprocedural hypothyroidism: Secondary | ICD-10-CM

## 2021-07-19 MED ORDER — LEVOTHYROXINE SODIUM 50 MCG PO TABS: 50.0000 ug | ORAL_TABLET | Freq: Every day | ORAL | 3 refills | Status: DC

## 2021-07-19 NOTE — Patient Instructions (Signed)

## 2021-07-19 NOTE — Progress Notes (Signed)
07/19/2021     Endocrinology Follow Up Note     Subjective:    Patient ID: Paula Holden, female    DOB: 09-11-1996, PCP Lenoria Chime, FNP.   No past medical history on file.  Past Surgical History:  Procedure Laterality Date   ADENOIDECTOMY     TONSILLECTOMY      Social History   Socioeconomic History   Marital status: Single    Spouse name: Not on file   Number of children: Not on file   Years of education: Not on file   Highest education level: Not on file  Occupational History   Not on file  Tobacco Use   Smoking status: Never   Smokeless tobacco: Never  Vaping Use   Vaping Use: Never used  Substance and Sexual Activity   Alcohol use: Yes    Comment: Social   Drug use: Never   Sexual activity: Not on file  Other Topics Concern   Not on file  Social History Narrative   Not on file   Social Determinants of Health   Financial Resource Strain: Not on file  Food Insecurity: Not on file  Transportation Needs: Not on file  Physical Activity: Not on file  Stress: Not on file  Social Connections: Not on file    Family History  Problem Relation Age of Onset   Hyperlipidemia Mother    Thyroid disease Maternal Grandmother     Outpatient Encounter Medications as of 07/19/2021  Medication Sig   buPROPion (WELLBUTRIN SR) 100 MG 12 hr tablet Take 100 mg by mouth every morning.   busPIRone (BUSPAR) 10 MG tablet Take 10 mg by mouth 2 (two) times daily.   fluticasone (FLONASE) 50 MCG/ACT nasal spray Place into both nostrils.   hydrOXYzine (ATARAX/VISTARIL) 10 MG tablet Take 20 mg by mouth at bedtime.   ibuprofen (ADVIL) 800 MG tablet Take 800 mg by mouth 2 (two) times daily as needed.   loratadine (CLARITIN) 10 MG tablet Take 10 mg by mouth daily.   montelukast (SINGULAIR) 10 MG tablet Take 1 tablet by mouth daily.   Norgestimate-Ethinyl Estradiol Triphasic 0.18/0.215/0.25 MG-25 MCG tab Take 1 tablet by mouth daily.   sertraline (ZOLOFT) 50 MG  tablet Take 50 mg by mouth daily.   traMADol (ULTRAM) 50 MG tablet Take 50 mg by mouth 3 (three) times daily.   traZODone (DESYREL) 50 MG tablet Take 50 mg by mouth at bedtime.   No facility-administered encounter medications on file as of 07/19/2021.    ALLERGIES: Allergies  Allergen Reactions   Augmentin [Amoxicillin-Pot Clavulanate] Nausea And Vomiting   Prednisone Anxiety    VACCINATION STATUS:  There is no immunization history on file for this patient.   HPI Thyroid Problem Presents for follow-up visit. The condition has lasted for 3 months. Symptoms include fatigue and weight gain. Patient reports no anxiety, palpitations or tremors. The symptoms have been worsening. Past treatments include nothing. Risk factors include family history of hyperthyroidism and family history of hypothyroidism.    Paula Holden is 25 y.o. female who presents today with a medical history as above. she is being seen in follow up after being seen in consultation for hyperthyroidism requested by Lenoria Chime, FNP.  she had been dealing with symptoms of anxiety, fatigue, palpitations, headache, near syncope, insomnia, throat tenderness, and tremors for 2-3 months.   she denies dysphagia, choking, shortness of breath, no recent voice change.    she does have family history  of thyroid dysfunction in her cousin (Graves disease), maternal grandmother (Hypo), and great aunt (unknown), but denies family hx of thyroid cancer. she denies personal history of goiter. she is not on any anti-thyroid medications nor on any thyroid hormone supplements. She does endorse use of Biotin containing supplements.  she is willing to proceed with appropriate work up and therapy for thyrotoxicosis.  She had RAI ablation for Graves disease on 04/01/21.   Review of systems  Constitutional: + steadily increasing body weight,  current Body mass index is 29.65 kg/m. , + fatigue, no subjective hyperthermia, no subjective  hypothermia Eyes: no blurry vision, no xerophthalmia ENT: no sore throat, no nodules palpated in throat, no dysphagia/odynophagia, no hoarseness Cardiovascular: no chest pain, no shortness of breath, no palpitations, no leg swelling Respiratory: no cough, no shortness of breath Gastrointestinal: no nausea/vomiting/diarrhea Musculoskeletal: no muscle/joint aches Skin: no rashes, no hyperemia Neurological: no tremors, no numbness, no tingling, no dizziness Psychiatric: no depression, no anxiety   Objective:    BP 133/85   Pulse 74   Ht 5\' 8"  (1.727 m)   Wt 195 lb (88.5 kg)   BMI 29.65 kg/m   Wt Readings from Last 3 Encounters:  07/19/21 195 lb (88.5 kg)  03/23/21 178 lb 9.6 oz (81 kg)  03/09/21 175 lb (79.4 kg)     BP Readings from Last 3 Encounters:  07/19/21 133/85  03/23/21 112/79  03/09/21 131/85                        Physical Exam- Limited  Constitutional:  Body mass index is 29.65 kg/m. , not in acute distress, normal state of mind Eyes:  EOMI, no exophthalmos Neck: Supple Cardiovascular: RRR, no murmurs, rubs, or gallops, no edema Respiratory: Adequate breathing efforts, no crackles, rales, rhonchi, or wheezing Musculoskeletal: no gross deformities, strength intact in all four extremities, no gross restriction of joint movements Skin:  no rashes, no hyperemia Neurological: no tremor with outstretched hands   CMP  No results found for: NA, K, CL, CO2, GLUCOSE, BUN, CREATININE, CALCIUM, PROT, ALBUMIN, AST, ALT, ALKPHOS, BILITOT, GFRNONAA, GFRAA   CBC No results found for: WBC, RBC, HGB, HCT, PLT, MCV, MCH, MCHC, RDW, LYMPHSABS, MONOABS, EOSABS, BASOSABS   Diabetic Labs (most recent): No results found for: HGBA1C  Lipid Panel  No results found for: CHOL, TRIG, HDL, CHOLHDL, VLDL, LDLCALC, LDLDIRECT, LABVLDL   Lab Results  Component Value Date   TSH 42.800 (H) 07/15/2021   TSH 0.267 (L) 05/07/2021   TSH 2.595 04/01/2021   TSH 2.260 03/19/2021   TSH  0.15 (A) 03/04/2021   FREET4 0.37 (L) 07/15/2021   FREET4 1.05 05/07/2021   FREET4 0.66 04/01/2021   FREET4 0.99 03/19/2021     Results for CARRINGTON, MULLENAX (MRN Paula Holden) as of 03/23/2021 08:19  Ref. Range 03/04/2021 00:00 03/19/2021 09:03 03/19/2021 09:32  TSH Latest Ref Range: 0.450 - 4.500 uIU/mL 0.15 (A)  2.260  T4,Free(Direct) Latest Ref Range: 0.82 - 1.77 ng/dL   05/19/2021  Thyroperoxidase Ab SerPl-aCnc Latest Ref Range: 0 - 34 IU/mL   35 (H)  Thyroglobulin Antibody Latest Ref Range: 0.0 - 0.9 IU/mL   3.8 (H)   Uptake and scan 03/19/21  CLINICAL DATA:  Suppressed TSH suspicious for hyperthyroidism, neck swelling and tenderness, difficulty swallowing, heat intolerance, difficulty sleeping, hand tremors, irritability and mood venous, increased perspiration, heart palpitations, weakness, dry skin   EXAM: THYROID SCAN AND UPTAKE - 4 AND 24 HOURS  TECHNIQUE: Following oral administration of I-123 capsule, anterior planar imaging was acquired at 24 hours. Thyroid uptake was calculated with a thyroid probe at 4-6 hours and 24 hours.   RADIOPHARMACEUTICALS:  304 uCi I-123 sodium iodide p.o.   COMPARISON:  None   FINDINGS: Homogeneous tracer distribution in both thyroid lobes.   No focal areas of increased or decreased tracer localization seen.   4 hour I-123 uptake = 23.5% (normal 5-20%)   24 hour I-123 uptake = 43.4% (normal 10-30%)   IMPRESSION: Normal thyroid scan.   Elevated 4 hour and 24 hour radio iodine uptakes consistent with hyperthyroidism.   Overall findings consistent with Graves disease.     Electronically Signed   By: Ulyses Southward M.D.   On: 03/19/2021 12:56   Results for UNITY, LUEPKE (MRN [id number]) as of 07/19/2021 13:12  Ref. Range 07/15/2021 10:22  TSH Latest Ref Range: 0.450 - 4.500 uIU/mL 42.800 (H)  Triiodothyronine,Free,Serum Latest Ref Range: 2.0 - 4.4 pg/mL 1.6 (L)  T4,Free(Direct) Latest Ref Range: 0.82 - 1.77 ng/dL 3.29 (L)    Assessment &  Plan:   1. Hypothyroidism s/p RAI ablation for Graves disease  she is being seen at a kind request of White, Raenette Rover, FNP.  She is status post RAI on 04/01/21.    Her previsit thyroid function tests are now consistent with hypothyroidism, indicating successful RAI ablation treatment.  She was initiated on Levothyroxine 50 mcg po daily before breakfast.   - The correct intake of thyroid hormone (Levothyroxine, Synthroid), is on empty stomach first thing in the morning, with water, separated by at least 30 minutes from breakfast and other medications,  and separated by more than 4 hours from calcium, iron, multivitamins, acid reflux medications (PPIs).  - This medication is a life-long medication and will be needed to correct thyroid hormone imbalances for the rest of your life.  The dose may change from time to time, based on thyroid blood work.  - It is extremely important to be consistent taking this medication, near the same time each morning.  -AVOID TAKING PRODUCTS CONTAINING BIOTIN (commonly found in Hair, Skin, Nails vitamins) AS IT INTERFERES WITH THE VALIDITY OF THYROID FUNCTION BLOOD TESTS.       -Patient is advised to maintain close follow up with Lenoria Chime, FNP for primary care needs.     I spent 20 minutes in the care of the patient today including review of labs from Thyroid Function, CMP, and other relevant labs ; imaging/biopsy records (current and previous including abstractions from other facilities); face-to-face time discussing  her lab results and symptoms, medications doses, her options of short and long term treatment based on the latest standards of care / guidelines;   and documenting the encounter.  [facility]  participated in the discussions, expressed understanding, and voiced agreement with the above plans.  All questions were answered to her satisfaction. she is encouraged to contact clinic should she have any questions or concerns prior to her  return visit.    Follow up plan: No follow-ups on file.   Thank you for involving me in the care of this pleasant patient, and I will continue to update you with her progress.  Ronny Bacon, [facility] [address] [place], [place] [address] Phone: [phone] Fax: [phone]  07/19/2021, 1:07 PM

## 2021-09-10 LAB — TSH: TSH: 19.6 u[IU]/mL — ABNORMAL HIGH (ref 0.450–4.500)

## 2021-09-10 LAB — T3, FREE: T3, Free: 2.7 pg/mL (ref 2.0–4.4)

## 2021-09-10 LAB — T4, FREE: Free T4: 0.88 ng/dL (ref 0.82–1.77)

## 2021-09-10 NOTE — Patient Instructions (Signed)

## 2021-09-13 ENCOUNTER — Encounter: Payer: Self-pay | Admitting: Nurse Practitioner

## 2021-09-13 ENCOUNTER — Ambulatory Visit (INDEPENDENT_AMBULATORY_CARE_PROVIDER_SITE_OTHER): Payer: 59 | Admitting: Nurse Practitioner

## 2021-09-13 ENCOUNTER — Other Ambulatory Visit: Payer: Self-pay

## 2021-09-13 VITALS — BP 104/71 | HR 80 | Ht 68.0 in | Wt 197.8 lb

## 2021-09-13 DIAGNOSIS — E890 Postprocedural hypothyroidism: Secondary | ICD-10-CM

## 2021-09-13 MED ORDER — LEVOTHYROXINE SODIUM 88 MCG PO TABS: 88.0000 ug | ORAL_TABLET | Freq: Every day | ORAL | 1 refills | Status: DC

## 2021-09-13 NOTE — Progress Notes (Signed)
09/13/2021     Endocrinology Follow Up Note     Subjective:    Patient ID: Paula Holden, female    DOB: 04/19/1996, PCP Lenoria Chime, FNP.   History reviewed. No pertinent past medical history.  Past Surgical History:  Procedure Laterality Date   ADENOIDECTOMY     TONSILLECTOMY      Social History   Socioeconomic History   Marital status: Single    Spouse name: Not on file   Number of children: Not on file   Years of education: Not on file   Highest education level: Not on file  Occupational History   Not on file  Tobacco Use   Smoking status: Never   Smokeless tobacco: Never  Vaping Use   Vaping Use: Never used  Substance and Sexual Activity   Alcohol use: Yes    Comment: Social   Drug use: Never   Sexual activity: Not on file  Other Topics Concern   Not on file  Social History Narrative   Not on file   Social Determinants of Health   Financial Resource Strain: Not on file  Food Insecurity: Not on file  Transportation Needs: Not on file  Physical Activity: Not on file  Stress: Not on file  Social Connections: Not on file    Family History  Problem Relation Age of Onset   Hyperlipidemia Mother    Thyroid disease Maternal Grandmother     Outpatient Encounter Medications as of 09/13/2021  Medication Sig   buPROPion (WELLBUTRIN SR) 200 MG 12 hr tablet Take 200 mg by mouth 2 (two) times daily.   busPIRone (BUSPAR) 10 MG tablet Take 10 mg by mouth 2 (two) times daily.   fluticasone (FLONASE) 50 MCG/ACT nasal spray Place into both nostrils.   hydrOXYzine (ATARAX/VISTARIL) 10 MG tablet Take 20 mg by mouth at bedtime.   ibuprofen (ADVIL) 800 MG tablet Take 800 mg by mouth 2 (two) times daily as needed.   loratadine (CLARITIN) 10 MG tablet Take 10 mg by mouth daily.   montelukast (SINGULAIR) 10 MG tablet Take 1 tablet by mouth daily.   sertraline (ZOLOFT) 50 MG tablet Take 50 mg by mouth daily.   traMADol (ULTRAM) 50 MG tablet Take 50 mg  by mouth 3 (three) times daily.   traZODone (DESYREL) 50 MG tablet Take 50 mg by mouth at bedtime.   TRI-ESTARYLLA 0.18/0.215/0.25 MG-35 MCG tablet Take 1 tablet by mouth daily.   [DISCONTINUED] levothyroxine (SYNTHROID) 50 MCG tablet Take 1 tablet (50 mcg total) by mouth daily.   levothyroxine (SYNTHROID) 88 MCG tablet Take 1 tablet (88 mcg total) by mouth daily.   [DISCONTINUED] buPROPion (WELLBUTRIN SR) 100 MG 12 hr tablet Take 100 mg by mouth every morning. (Patient not taking: Reported on 09/13/2021)   [DISCONTINUED] Norgestimate-Ethinyl Estradiol Triphasic 0.18/0.215/0.25 MG-25 MCG tab Take 1 tablet by mouth daily. (Patient not taking: Reported on 09/13/2021)   No facility-administered encounter medications on file as of 09/13/2021.    ALLERGIES: Allergies  Allergen Reactions   Augmentin [Amoxicillin-Pot Clavulanate] Nausea And Vomiting   Prednisone Anxiety    VACCINATION STATUS:  There is no immunization history on file for this patient.   HPI Thyroid Problem Presents for follow-up visit. The condition has lasted for 3 months. Symptoms include weight gain. Patient reports no anxiety, cold intolerance, constipation, depressed mood, fatigue, leg swelling, palpitations or tremors. The symptoms have been stable. Past treatments include nothing. Risk factors include family history of hyperthyroidism  and family history of hypothyroidism.    Paula Holden is 25 y.o. female who presents today with a medical history as above. she is being seen in follow up after being seen in consultation for hyperthyroidism requested by Lenoria Chime, FNP.  she had been dealing with symptoms of anxiety, fatigue, palpitations, headache, near syncope, insomnia, throat tenderness, and tremors for 2-3 months.   she denies dysphagia, choking, shortness of breath, no recent voice change.    she does have family history of thyroid dysfunction in her cousin (Graves disease), maternal grandmother (Hypo), and  great aunt (unknown), but denies family hx of thyroid cancer. she denies personal history of goiter. she is not on any anti-thyroid medications nor on any thyroid hormone supplements. She does endorse use of Biotin containing supplements.  she is willing to proceed with appropriate work up and therapy for thyrotoxicosis.  She had RAI ablation for Graves disease on 04/01/21.   Review of systems  Constitutional: + steadily increasing body weight,  current Body mass index is 30.08 kg/m. , + fatigue-improved, no subjective hyperthermia, no subjective hypothermia Eyes: no blurry vision, no xerophthalmia ENT: no sore throat, no nodules palpated in throat, no dysphagia/odynophagia, no hoarseness Cardiovascular: no chest pain, no shortness of breath, no palpitations, no leg swelling Respiratory: no cough, no shortness of breath Gastrointestinal: no nausea/vomiting/diarrhea Musculoskeletal: no muscle/joint aches Skin: no rashes, no hyperemia Neurological: no tremors, no numbness, no tingling, no dizziness Psychiatric: no depression, no anxiety   Objective:    BP 104/71   Pulse 80   Ht 5\' 8"  (1.727 m)   Wt 197 lb 12.8 oz (89.7 kg)   BMI 30.08 kg/m   Wt Readings from Last 3 Encounters:  09/13/21 197 lb 12.8 oz (89.7 kg)  07/19/21 195 lb (88.5 kg)  03/23/21 178 lb 9.6 oz (81 kg)     BP Readings from Last 3 Encounters:  09/13/21 104/71  07/19/21 133/85  03/23/21 112/79    Physical Exam- Limited  Constitutional:  Body mass index is 30.08 kg/m. , not in acute distress, normal state of mind Eyes:  EOMI, no exophthalmos Neck: Supple Cardiovascular: RRR, no murmurs, rubs, or gallops, no edema Respiratory: Adequate breathing efforts, no crackles, rales, rhonchi, or wheezing Musculoskeletal: no gross deformities, strength intact in all four extremities, no gross restriction of joint movements Skin:  no rashes, no hyperemia Neurological: no tremor with outstretched hands   CMP  No  results found for: NA, K, CL, CO2, GLUCOSE, BUN, CREATININE, CALCIUM, PROT, ALBUMIN, AST, ALT, ALKPHOS, BILITOT, GFRNONAA, GFRAA   CBC No results found for: WBC, RBC, HGB, HCT, PLT, MCV, MCH, MCHC, RDW, LYMPHSABS, MONOABS, EOSABS, BASOSABS   Diabetic Labs (most recent): No results found for: HGBA1C  Lipid Panel  No results found for: CHOL, TRIG, HDL, CHOLHDL, VLDL, LDLCALC, LDLDIRECT, LABVLDL   Lab Results  Component Value Date   TSH 19.600 (H) 09/09/2021   TSH 42.800 (H) 07/15/2021   TSH 0.267 (L) 05/07/2021   TSH 2.595 04/01/2021   TSH 2.260 03/19/2021   TSH 0.15 (A) 03/04/2021   FREET4 0.88 09/09/2021   FREET4 0.37 (L) 07/15/2021   FREET4 1.05 05/07/2021   FREET4 0.66 04/01/2021   FREET4 0.99 03/19/2021     Results for BRITTAINY, BUCKER (MRN Paula Holden) as of 03/23/2021 08:19  Ref. Range 03/04/2021 00:00 03/19/2021 09:03 03/19/2021 09:32  TSH Latest Ref Range: 0.450 - 4.500 uIU/mL 0.15 (A)  2.260  T4,Free(Direct) Latest Ref Range: 0.82 - 1.77 ng/dL  0.99  Thyroperoxidase Ab SerPl-aCnc Latest Ref Range: 0 - 34 IU/mL   35 (H)  Thyroglobulin Antibody Latest Ref Range: 0.0 - 0.9 IU/mL   3.8 (H)   Uptake and scan 03/19/21  CLINICAL DATA:  Suppressed TSH suspicious for hyperthyroidism, neck swelling and tenderness, difficulty swallowing, heat intolerance, difficulty sleeping, hand tremors, irritability and mood venous, increased perspiration, heart palpitations, weakness, dry skin   EXAM: THYROID SCAN AND UPTAKE - 4 AND 24 HOURS   TECHNIQUE: Following oral administration of I-123 capsule, anterior planar imaging was acquired at 24 hours. Thyroid uptake was calculated with a thyroid probe at 4-6 hours and 24 hours.   RADIOPHARMACEUTICALS:  304 uCi I-123 sodium iodide p.o.   COMPARISON:  None   FINDINGS: Homogeneous tracer distribution in both thyroid lobes.   No focal areas of increased or decreased tracer localization seen.   4 hour I-123 uptake = 23.5% (normal 5-20%)    24 hour I-123 uptake = 43.4% (normal 10-30%)   IMPRESSION: Normal thyroid scan.   Elevated 4 hour and 24 hour radio iodine uptakes consistent with hyperthyroidism.   Overall findings consistent with Graves disease.     Electronically Signed   By: Ulyses Southward M.D.   On: 03/19/2021 12:56   Results for MEAGHAN, WHISTLER (MRN [id number]) as of 07/19/2021 13:12  Ref. Range 07/15/2021 10:22  TSH Latest Ref Range: 0.450 - 4.500 uIU/mL 42.800 (H)  Triiodothyronine,Free,Serum Latest Ref Range: 2.0 - 4.4 pg/mL 1.6 (L)  T4,Free(Direct) Latest Ref Range: 0.82 - 1.77 ng/dL 7.06 (L)    Assessment & Plan:   1. Hypothyroidism s/p RAI ablation for Graves disease  she is being seen at a kind request of White, Raenette Rover, FNP.  She is status post RAI on 04/01/21.    Her previsit thyroid function tests are consistent with under-replacement.  She is advised to increase her Levothyroxine to 88 mcg po daily before breakfast.     - The correct intake of thyroid hormone (Levothyroxine, Synthroid), is on empty stomach first thing in the morning, with water, separated by at least 30 minutes from breakfast and other medications,  and separated by more than 4 hours from calcium, iron, multivitamins, acid reflux medications (PPIs).  - This medication is a life-long medication and will be needed to correct thyroid hormone imbalances for the rest of your life.  The dose may change from time to time, based on thyroid blood work.  - It is extremely important to be consistent taking this medication, near the same time each morning.  -AVOID TAKING PRODUCTS CONTAINING BIOTIN (commonly found in Hair, Skin, Nails vitamins) AS IT INTERFERES WITH THE VALIDITY OF THYROID FUNCTION BLOOD TESTS.       -Patient is advised to maintain close follow up with Lenoria Chime, FNP for primary care needs.     I spent 20 minutes in the care of the patient today including review of labs from Thyroid Function, CMP, and other  relevant labs ; imaging/biopsy records (current and previous including abstractions from other facilities); face-to-face time discussing  her lab results and symptoms, medications doses, her options of short and long term treatment based on the latest standards of care / guidelines;   and documenting the encounter.  [facility]  participated in the discussions, expressed understanding, and voiced agreement with the above plans.  All questions were answered to her satisfaction. she is encouraged to contact clinic should she have any questions or concerns prior to her return visit.  Follow up plan: Return in about 3 months (around 12/14/2021) for Thyroid follow up, Previsit labs.   Ronny Bacon, [facility] [address] [place], [place] [address] Phone: [phone] Fax: [phone]  09/13/2021, 2:03 PM

## 2021-12-15 ENCOUNTER — Ambulatory Visit: Payer: 59 | Admitting: Nurse Practitioner

## 2021-12-23 ENCOUNTER — Ambulatory Visit (INDEPENDENT_AMBULATORY_CARE_PROVIDER_SITE_OTHER): Payer: 59 | Admitting: Nurse Practitioner

## 2021-12-23 ENCOUNTER — Encounter: Payer: Self-pay | Admitting: Nurse Practitioner

## 2021-12-23 ENCOUNTER — Other Ambulatory Visit: Payer: Self-pay

## 2021-12-23 VITALS — BP 111/76 | HR 84 | Ht 68.0 in | Wt 200.8 lb

## 2021-12-23 DIAGNOSIS — E890 Postprocedural hypothyroidism: Secondary | ICD-10-CM

## 2021-12-23 DIAGNOSIS — Z833 Family history of diabetes mellitus: Secondary | ICD-10-CM

## 2021-12-23 DIAGNOSIS — Z131 Encounter for screening for diabetes mellitus: Secondary | ICD-10-CM | POA: Diagnosis not present

## 2021-12-23 LAB — T4, FREE: Free T4: 1.19 ng/dL (ref 0.82–1.77)

## 2021-12-23 LAB — TSH: TSH: 4.24 u[IU]/mL (ref 0.450–4.500)

## 2021-12-23 MED ORDER — LEVOTHYROXINE SODIUM 100 MCG PO TABS: 100.0000 ug | ORAL_TABLET | Freq: Every day | ORAL | 3 refills | Status: DC

## 2021-12-23 NOTE — Patient Instructions (Signed)

## 2021-12-23 NOTE — Progress Notes (Signed)
12/23/2021     Endocrinology Follow Up Note     Subjective:    Patient ID: Paula Holden, female    DOB: [birth year], PCP Lenoria Chime, FNP.   History reviewed. No pertinent past medical history.  Past Surgical History:  Procedure Laterality Date   ADENOIDECTOMY     TONSILLECTOMY      Social History   Socioeconomic History   Marital status: Single    Spouse name: Not on file   Number of children: Not on file   Years of education: Not on file   Highest education level: Not on file  Occupational History   Not on file  Tobacco Use   Smoking status: Never   Smokeless tobacco: Never  Vaping Use   Vaping Use: Never used  Substance and Sexual Activity   Alcohol use: Yes    Comment: Social   Drug use: Never   Sexual activity: Not on file  Other Topics Concern   Not on file  Social History Narrative   Not on file   Social Determinants of Health   Financial Resource Strain: Not on file  Food Insecurity: Not on file  Transportation Needs: Not on file  Physical Activity: Not on file  Stress: Not on file  Social Connections: Not on file    Family History  Problem Relation Age of Onset   Hyperlipidemia Mother    Thyroid disease Maternal Grandmother     Outpatient Encounter Medications as of 12/23/2021  Medication Sig   buPROPion (WELLBUTRIN SR) 200 MG 12 hr tablet Take 200 mg by mouth 2 (two) times daily.   busPIRone (BUSPAR) 10 MG tablet Take 10 mg by mouth 2 (two) times daily.   fluticasone (FLONASE) 50 MCG/ACT nasal spray Place into both nostrils.   hydrOXYzine (ATARAX/VISTARIL) 10 MG tablet Take 20 mg by mouth at bedtime.   ibuprofen (ADVIL) 800 MG tablet Take 800 mg by mouth 2 (two) times daily as needed.   montelukast (SINGULAIR) 10 MG tablet Take 1 tablet by mouth daily.   sertraline (ZOLOFT) 50 MG tablet Take 50 mg by mouth daily.   traMADol (ULTRAM) 50 MG tablet Take 50 mg by mouth 3 (three) times daily.   traZODone (DESYREL) 50 MG  tablet Take 50 mg by mouth at bedtime.   TRI-ESTARYLLA 0.18/0.215/0.25 MG-35 MCG tablet Take 1 tablet by mouth daily.   [DISCONTINUED] levothyroxine (SYNTHROID) 88 MCG tablet Take 1 tablet (88 mcg total) by mouth daily.   levothyroxine (SYNTHROID) 100 MCG tablet Take 1 tablet (100 mcg total) by mouth daily before breakfast.   [DISCONTINUED] fluconazole (DIFLUCAN) 150 MG tablet Take 150 mg by mouth once. (Patient not taking: Reported on 12/23/2021)   [DISCONTINUED] loratadine (CLARITIN) 10 MG tablet Take 10 mg by mouth daily.   No facility-administered encounter medications on file as of 12/23/2021.    ALLERGIES: Allergies  Allergen Reactions   Augmentin [Amoxicillin-Pot Clavulanate] Nausea And Vomiting   Prednisone Anxiety    VACCINATION STATUS:  There is no immunization history on file for this patient.   HPI Thyroid Problem Presents for follow-up visit. The condition has lasted for 3 months. Symptoms include weight gain. Patient reports no anxiety, cold intolerance, constipation, depressed mood, fatigue, leg swelling, palpitations or tremors. The symptoms have been stable. Past treatments include nothing. Risk factors include family history of hyperthyroidism and family history of hypothyroidism.    Paula Holden is 26 y.o. female who presents today with a medical history as above.  she is being seen in follow up after being seen in consultation for hyperthyroidism requested by Lenoria ChimeWhite, Valerie A, FNP.  she had been dealing with symptoms of anxiety, fatigue, palpitations, headache, near syncope, insomnia, throat tenderness, and tremors for 2-3 months.   she denies dysphagia, choking, shortness of breath, no recent voice change.    she does have family history of thyroid dysfunction in her cousin (Graves disease), maternal grandmother (Hypo), and great aunt (unknown), but denies family hx of thyroid cancer. she denies personal history of goiter. she is not on any anti-thyroid medications  nor on any thyroid hormone supplements. She does endorse use of Biotin containing supplements.  she is willing to proceed with appropriate work up and therapy for thyrotoxicosis.  She had RAI ablation for Graves disease on 04/01/21.  Review of systems  Constitutional: + Minimally fluctuating body weight,  current Body mass index is 30.53 kg/m. , no fatigue, no subjective hyperthermia, no subjective hypothermia Eyes: no blurry vision, no xerophthalmia ENT: no sore throat, no nodules palpated in throat, no dysphagia/odynophagia, no hoarseness Cardiovascular: no chest pain, no shortness of breath, no palpitations, no leg swelling Respiratory: no cough, no shortness of breath Gastrointestinal: no nausea/vomiting/diarrhea Musculoskeletal: no muscle/joint aches Skin: no rashes, no hyperemia Neurological: no tremors, no numbness, no tingling, no dizziness Psychiatric: no depression, no anxiety   Objective:    BP 111/76    Pulse 84    Ht 5\' 8"  (1.727 m)    Wt 200 lb 12.8 oz (91.1 kg)    SpO2 100%    BMI 30.53 kg/m   Wt Readings from Last 3 Encounters:  12/23/21 200 lb 12.8 oz (91.1 kg)  09/13/21 197 lb 12.8 oz (89.7 kg)  07/19/21 195 lb (88.5 kg)     BP Readings from Last 3 Encounters:  12/23/21 111/76  09/13/21 104/71  07/19/21 133/85     Physical Exam- Limited  Constitutional:  Body mass index is 30.53 kg/m. , not in acute distress, normal state of mind Eyes:  EOMI, no exophthalmos Neck: Supple Cardiovascular: RRR, no murmurs, rubs, or gallops, no edema Respiratory: Adequate breathing efforts, no crackles, rales, rhonchi, or wheezing Musculoskeletal: no gross deformities, strength intact in all four extremities, no gross restriction of joint movements Skin:  no rashes, no hyperemia Neurological: no tremor with outstretched hands   CMP  No results found for: NA, K, CL, CO2, GLUCOSE, BUN, CREATININE, CALCIUM, PROT, ALBUMIN, AST, ALT, ALKPHOS, BILITOT, GFRNONAA,  GFRAA   CBC No results found for: WBC, RBC, HGB, HCT, PLT, MCV, MCH, MCHC, RDW, LYMPHSABS, MONOABS, EOSABS, BASOSABS   Diabetic Labs (most recent): No results found for: HGBA1C  Lipid Panel  No results found for: CHOL, TRIG, HDL, CHOLHDL, VLDL, LDLCALC, LDLDIRECT, LABVLDL   Lab Results  Component Value Date   TSH 4.240 12/22/2021   TSH 19.600 (H) 09/09/2021   TSH 42.800 (H) 07/15/2021   TSH 0.267 (L) 05/07/2021   TSH 2.595 04/01/2021   TSH 2.260 03/19/2021   TSH 0.15 (A) 03/04/2021   FREET4 1.19 12/22/2021   FREET4 0.88 09/09/2021   FREET4 0.37 (L) 07/15/2021   FREET4 1.05 05/07/2021   FREET4 0.66 04/01/2021   FREET4 0.99 03/19/2021     Results for Paula AlkenCUSTER, Peytan (MRN [id number]) as of 03/23/2021 08:19  Ref. Range 03/04/2021 00:00 03/19/2021 09:03 03/19/2021 09:32  TSH Latest Ref Range: 0.450 - 4.500 uIU/mL 0.15 (A)  2.260  T4,Free(Direct) Latest Ref Range: 0.82 - 1.77 ng/dL   4.090.99  Thyroperoxidase  Ab SerPl-aCnc Latest Ref Range: 0 - 34 IU/mL   35 (H)  Thyroglobulin Antibody Latest Ref Range: 0.0 - 0.9 IU/mL   3.8 (H)   Uptake and scan 03/19/21  CLINICAL DATA:  Suppressed TSH suspicious for hyperthyroidism, neck swelling and tenderness, difficulty swallowing, heat intolerance, difficulty sleeping, hand tremors, irritability and mood venous, increased perspiration, heart palpitations, weakness, dry skin   EXAM: THYROID SCAN AND UPTAKE - 4 AND 24 HOURS   TECHNIQUE: Following oral administration of I-123 capsule, anterior planar imaging was acquired at 24 hours. Thyroid uptake was calculated with a thyroid probe at 4-6 hours and 24 hours.   RADIOPHARMACEUTICALS:  304 uCi I-123 sodium iodide p.o.   COMPARISON:  None   FINDINGS: Homogeneous tracer distribution in both thyroid lobes.   No focal areas of increased or decreased tracer localization seen.   4 hour I-123 uptake = 23.5% (normal 5-20%)   24 hour I-123 uptake = 43.4% (normal 10-30%)   IMPRESSION: Normal  thyroid scan.   Elevated 4 hour and 24 hour radio iodine uptakes consistent with hyperthyroidism.   Overall findings consistent with Graves disease.     Electronically Signed   By: Ulyses SouthwardMark  Boles M.D.   On: 03/19/2021 12:56   Results for Paula AlkenCUSTER, Tashonna (MRN [id number]) as of 07/19/2021 13:12  Ref. Range 07/15/2021 10:22  TSH Latest Ref Range: 0.450 - 4.500 uIU/mL 42.800 (H)  Triiodothyronine,Free,Serum Latest Ref Range: 2.0 - 4.4 pg/mL 1.6 (L)  T4,Free(Direct) Latest Ref Range: 0.82 - 1.77 ng/dL 6.210.37 (L)     Latest Reference Range & Units 05/07/21 14:18 07/15/21 10:22 09/09/21 10:29 12/22/21 08:06  TSH 0.450 - 4.500 uIU/mL 0.267 (L) 42.800 (H) 19.600 (H) 4.240  Triiodothyronine,Free,Serum 2.0 - 4.4 pg/mL  1.6 (L) 2.7   T4,Free(Direct) 0.82 - 1.77 ng/dL 3.081.05 6.570.37 (L) 8.460.88 9.621.19  (L): Data is abnormally low (H): Data is abnormally high  Assessment & Plan:   1. Hypothyroidism s/p RAI ablation for Graves disease  she is being seen at a kind request of White, Raenette RoverValerie A, FNP.  She is status post RAI on 04/01/21.    Her previsit thyroid function tests are consistent with under-replacement.  She is advised to increase her Levothyroxine to 100 mcg po daily before breakfast.     - The correct intake of thyroid hormone (Levothyroxine, Synthroid), is on empty stomach first thing in the morning, with water, separated by at least 30 minutes from breakfast and other medications,  and separated by more than 4 hours from calcium, iron, multivitamins, acid reflux medications (PPIs).  - This medication is a life-long medication and will be needed to correct thyroid hormone imbalances for the rest of your life.  The dose may change from time to time, based on thyroid blood work.  - It is extremely important to be consistent taking this medication, near the same time each morning.  -AVOID TAKING PRODUCTS CONTAINING BIOTIN (commonly found in Hair, Skin, Nails vitamins) AS IT INTERFERES WITH THE VALIDITY  OF THYROID FUNCTION BLOOD TESTS.  2. Family history of Type 2 Diabetes  She asked today if we could do screening for diabetes.  She does have a physical set up with her PCP in March but isn't sure if she will monitor for diabetes with blood work, thus she asked if we could add on testing prior to next visit.  Will check CMP and A1c prior to next visit.  She is aware not to do the labs if her PCP orders these  tests to prevent duplication of efforts.     -Patient is advised to maintain close follow up with Lenoria Chime, FNP for primary care needs.    I spent 20 minutes in the care of the patient today including review of labs from Thyroid Function, CMP, and other relevant labs ; imaging/biopsy records (current and previous including abstractions from other facilities); face-to-face time discussing  her lab results and symptoms, medications doses, her options of short and long term treatment based on the latest standards of care / guidelines;   and documenting the encounter.  [facility]  participated in the discussions, expressed understanding, and voiced agreement with the above plans.  All questions were answered to her satisfaction. she is encouraged to contact clinic should she have any questions or concerns prior to her return visit.    Follow up plan: Return in about 3 months (around 03/23/2022) for Thyroid follow up, Previsit labs.   Ronny Bacon, [facility] [address] [place], [place] [address] Phone: [phone] Fax: [phone]  12/23/2021, 8:33 AM

## 2022-01-14 ENCOUNTER — Other Ambulatory Visit: Payer: Self-pay

## 2022-01-14 ENCOUNTER — Ambulatory Visit
Admission: EM | Admit: 2022-01-14 | Discharge: 2022-01-14 | Disposition: A | Discharge status: Home / Self Care | Payer: 59 | Attending: Family Medicine | Admitting: Family Medicine

## 2022-01-14 DIAGNOSIS — J029 Acute pharyngitis, unspecified: Secondary | ICD-10-CM

## 2022-01-14 DIAGNOSIS — R051 Acute cough: Secondary | ICD-10-CM | POA: Diagnosis not present

## 2022-01-14 DIAGNOSIS — J069 Acute upper respiratory infection, unspecified: Secondary | ICD-10-CM | POA: Diagnosis not present

## 2022-01-14 LAB — POCT RAPID STREP A (OFFICE): Rapid Strep A Screen: NEGATIVE

## 2022-01-14 LAB — POCT MONO SCREEN (KUC): Mono, POC: NEGATIVE

## 2022-01-14 MED ORDER — PREDNISONE 20 MG PO TABS: 40.0000 mg | ORAL_TABLET | Freq: Every day | ORAL | 0 refills | Status: DC

## 2022-01-14 MED ORDER — PROMETHAZINE-DM 6.25-15 MG/5ML PO SYRP: 5.0000 mL | ORAL_SOLUTION | Freq: Four times a day (QID) | ORAL | 0 refills | Status: DC | PRN

## 2022-01-14 NOTE — ED Provider Notes (Signed)
[facility]    CSN: [id number] Arrival date & time: 01/14/22  1120      History   Chief Complaint Chief Complaint  Patient presents with   Cough   Sore Throat        Fever    HPI Paula Holden is a 26 y.o. female.   Presenting today with about a week of ongoing cough, congestion, sore throat, fever off and on but now since yesterday high fever of 102, ears feel stopped up, worsening sore throat, nasal congestion, cough.  Denies chest pain, shortness of breath, abdominal pain, nausea vomiting or diarrhea.  Was taking DayQuil and NyQuil but stopped about 5 days ago as she did not feel like it was helping.  Not currently taking anything for symptoms.  No new sick contacts recently.  Was seen at initial onset of symptoms last week and tested for COVID, flu, strep and all were negative.  Requesting to be retested today.   History reviewed. No pertinent past medical history.  There are no problems to display for this patient.   Past Surgical History:  Procedure Laterality Date   ADENOIDECTOMY     TONSILLECTOMY      OB History   No obstetric history on file.      Home Medications    Prior to Admission medications   Medication Sig Start Date End Date Taking? Authorizing Provider  predniSONE (DELTASONE) 20 MG tablet Take 2 tablets (40 mg total) by mouth daily with breakfast. 01/14/22  Yes Particia NearingLane, Tyann Niehaus Elizabeth, PA-C  promethazine-dextromethorphan (PROMETHAZINE-DM) 6.25-15 MG/5ML syrup Take 5 mLs by mouth 4 (four) times daily as needed. 01/14/22  Yes Particia NearingLane, Kalen Ratajczak Elizabeth, PA-C  buPROPion [facility](WELLBUTRIN SR) 200 MG 12 hr tablet Take 200 mg by mouth 2 (two) times daily. 09/10/21   [provider]  busPIRone (BUSPAR) 10 MG tablet Take 10 mg by mouth 2 (two) times daily. 12/01/20   [provider]  fluticasone (FLONASE) 50 MCG/ACT nasal spray Place into both nostrils. 11/12/20   [provider]  hydrOXYzine (ATARAX/VISTARIL) 10 MG tablet Take 20  mg by mouth at bedtime. 01/30/21   [provider]  ibuprofen (ADVIL) 800 MG tablet Take 800 mg by mouth 2 (two) times daily as needed. 10/12/20   [provider]  levothyroxine (SYNTHROID) 100 MCG tablet Take 1 tablet (100 mcg total) by mouth daily before breakfast. 12/23/21   Dani Gobbleeardon, Whitney J, NP  montelukast (SINGULAIR) 10 MG tablet Take 1 tablet by mouth daily. 03/18/21   [provider]  sertraline (ZOLOFT) 50 MG tablet Take 50 mg by mouth daily. 03/03/21   [provider]  traMADol (ULTRAM) 50 MG tablet Take 50 mg by mouth 3 (three) times daily. 04/07/21   [provider]  traZODone (DESYREL) 50 MG tablet Take 50 mg by mouth at bedtime. 07/15/21   [provider]  TRI-ESTARYLLA 0.18/0.215/0.25 MG-35 MCG tablet Take 1 tablet by mouth daily. 07/23/21   [provider]    Family History Family History  Problem Relation Age of Onset   Hyperlipidemia Mother    Thyroid disease Maternal Grandmother     Social History Social History   Tobacco Use   Smoking status: Never   Smokeless tobacco: Never  Vaping Use   Vaping Use: Never used  Substance Use Topics   Alcohol use: Yes    Comment: Social   Drug use: Never     Allergies   Augmentin [amoxicillin-pot clavulanate] and Prednisone  Review of Systems Review of Systems Per HPI  Physical Exam Triage Vital Signs ED Triage Vitals  Enc Vitals Group     BP 01/14/22 1159 117/79     Pulse Rate 01/14/22 1159 85     Resp 01/14/22 1159 16     Temp 01/14/22 1159 98.5 F (36.9 C)     Temp Source 01/14/22 1159 Oral     SpO2 01/14/22 1159 97 %     Weight --      Height --      Head Circumference --      Peak Flow --      Pain Score 01/14/22 1201 7     Pain Loc --      Pain Edu? --      Excl. in GC? --    No data found.  Updated Vital Signs BP 117/79 (BP Location: Right Arm)    Pulse 85    Temp 98.5 F (36.9 C) (Oral)    Resp 16    SpO2 97%   Visual Acuity Right Eye  Distance:   Left Eye Distance:   Bilateral Distance:    Right Eye Near:   Left Eye Near:    Bilateral Near:     Physical Exam Vitals and nursing note reviewed.  Constitutional:      Appearance: Normal appearance.  HENT:     Head: Atraumatic.     Right Ear: External ear normal.     Left Ear: External ear normal.     Ears:     Comments: Bilateral middle ear effusion    Nose: Rhinorrhea present.     Mouth/Throat:     Mouth: Mucous membranes are moist.     Pharynx: Posterior oropharyngeal erythema present.  Eyes:     Extraocular Movements: Extraocular movements intact.     Conjunctiva/sclera: Conjunctivae normal.  Cardiovascular:     Rate and Rhythm: Normal rate and regular rhythm.     Heart sounds: Normal heart sounds.  Pulmonary:     Effort: Pulmonary effort is normal.     Breath sounds: Normal breath sounds. No wheezing or rales.  Musculoskeletal:        General: Normal range of motion.     Cervical back: Normal range of motion and neck supple.  Skin:    General: Skin is warm and dry.  Neurological:     Mental Status: She is alert and oriented to person, place, and time.  Psychiatric:        Mood and Affect: Mood normal.        Thought Content: Thought content normal.   UC Treatments / Results  Labs (all labs ordered are listed, but only abnormal results are displayed) Labs Reviewed  COVID-19, FLU A+B NAA  CULTURE, GROUP A STREP [facility])  POCT RAPID STREP A (OFFICE)  POCT MONO SCREEN ([facility])   EKG  Radiology No results found.  Procedures Procedures (including critical care time)  Medications Ordered in UC Medications - No data to display  Initial Impression / Assessment and Plan / UC Course  I have reviewed the triage vital signs and the nursing notes.  Pertinent labs & imaging results that were available during my care of the patient were reviewed by me and considered in my medical decision making (see chart for details).     Vital signs benign and  reassuring, exam suspicious for ongoing viral upper respiratory infection.  Rapid strep, rapid mono testing both negative, COVID and flu testing and  throat culture pending.  We will treat with prednisone, Phenergan DM, over-the-counter supportive medications and home care.  Discussed return precautions for worsening symptoms.  Work note given.  Final Clinical Impressions(s) / UC Diagnoses   Final diagnoses:  Sore throat  Viral URI with cough   Discharge Instructions   None    ED Prescriptions     Medication Sig Dispense Auth. Provider   predniSONE (DELTASONE) 20 MG tablet Take 2 tablets (40 mg total) by mouth daily with breakfast. 10 tablet Particia Nearing, PA-C   promethazine-dextromethorphan (PROMETHAZINE-DM) 6.25-15 MG/5ML syrup Take 5 mLs by mouth 4 (four) times daily as needed. 100 mL Particia Nearing, [place]      PDMP not reviewed this encounter.   Particia Nearing, [place] 01/14/22 1321

## 2022-01-14 NOTE — ED Triage Notes (Signed)
Pt reports cough, fever 102.0 F, ears feels clogged,  and sore throat since last night.   Pt requested COVID, flu and mono test.

## 2022-01-15 LAB — COVID-19, FLU A+B NAA
Influenza A, NAA: NOT DETECTED
Influenza B, NAA: NOT DETECTED
SARS-CoV-2, NAA: NOT DETECTED

## 2022-01-17 LAB — CULTURE, GROUP A STREP (THRC)

## 2022-03-22 LAB — COMPREHENSIVE METABOLIC PANEL
ALT: 11 IU/L (ref 0–32)
AST: 12 IU/L (ref 0–40)
Albumin/Globulin Ratio: 1.8 (ref 1.2–2.2)
Albumin: 4 g/dL (ref 3.9–5.0)
Alkaline Phosphatase: 70 IU/L (ref 44–121)
BUN/Creatinine Ratio: 10 (ref 9–23)
BUN: 8 mg/dL (ref 6–20)
Bilirubin Total: 0.5 mg/dL (ref 0.0–1.2)
CO2: 20 mmol/L (ref 20–29)
Calcium: 8.5 mg/dL — ABNORMAL LOW (ref 8.7–10.2)
Chloride: 106 mmol/L (ref 96–106)
Creatinine, Ser: 0.77 mg/dL (ref 0.57–1.00)
Globulin, Total: 2.2 g/dL (ref 1.5–4.5)
Glucose: 82 mg/dL (ref 70–99)
Potassium: 4 mmol/L (ref 3.5–5.2)
Sodium: 139 mmol/L (ref 134–144)
Total Protein: 6.2 g/dL (ref 6.0–8.5)
eGFR: 110 mL/min/{1.73_m2} (ref >59)

## 2022-03-22 LAB — HEMOGLOBIN A1C
Est. average glucose Bld gHb Est-mCnc: 100 mg/dL
Hgb A1c MFr Bld: 5.1 % (ref 4.8–5.6)

## 2022-03-22 LAB — TSH: TSH: 1.42 u[IU]/mL (ref 0.450–4.500)

## 2022-03-22 LAB — T4, FREE: Free T4: 1.13 ng/dL (ref 0.82–1.77)

## 2022-03-22 NOTE — Patient Instructions (Signed)

## 2022-03-23 ENCOUNTER — Encounter: Payer: Self-pay | Admitting: Nurse Practitioner

## 2022-03-23 ENCOUNTER — Ambulatory Visit (INDEPENDENT_AMBULATORY_CARE_PROVIDER_SITE_OTHER): Payer: 59 | Admitting: Nurse Practitioner

## 2022-03-23 VITALS — BP 102/69 | HR 69 | Ht 68.0 in | Wt 207.4 lb

## 2022-03-23 DIAGNOSIS — E890 Postprocedural hypothyroidism: Secondary | ICD-10-CM

## 2022-03-23 MED ORDER — LEVOTHYROXINE SODIUM 100 MCG PO TABS: 100.0000 ug | ORAL_TABLET | Freq: Every day | ORAL | 3 refills | Status: DC

## 2022-03-23 NOTE — Progress Notes (Signed)
? ? ? 03/23/2022   ? ? ?Endocrinology Follow Up Note  ? ? ? ?Subjective:  ? ? Patient ID: Paula Holden, female    DOB: October 31, 1996, PCP Ocie Bob, FNP. ? ? ?History reviewed. No pertinent past medical history. ? ?Past Surgical History:  ?Procedure Laterality Date  ? ADENOIDECTOMY    ? TONSILLECTOMY    ? ? ?Social History  ? ?Socioeconomic History  ? Marital status: Single  ?  Spouse name: Not on file  ? Number of children: Not on file  ? Years of education: Not on file  ? Highest education level: Not on file  ?Occupational History  ? Not on file  ?Tobacco Use  ? Smoking status: Never  ? Smokeless tobacco: Never  ?Vaping Use  ? Vaping Use: Never used  ?Substance and Sexual Activity  ? Alcohol use: Yes  ?  Comment: Social  ? Drug use: Never  ? Sexual activity: Not Currently  ?Other Topics Concern  ? Not on file  ?Social History Narrative  ? Not on file  ? ?Social Determinants of Health  ? ?Financial Resource Strain: Not on file  ?Food Insecurity: Not on file  ?Transportation Needs: Not on file  ?Physical Activity: Not on file  ?Stress: Not on file  ?Social Connections: Not on file  ? ? ?Family History  ?Problem Relation Age of Onset  ? Hyperlipidemia Mother   ? Thyroid disease Maternal Grandmother   ? ? ?Outpatient Encounter Medications as of 03/23/2022  ?Medication Sig  ? buPROPion (WELLBUTRIN SR) 200 MG 12 hr tablet Take 200 mg by mouth 2 (two) times daily.  ? busPIRone (BUSPAR) 10 MG tablet Take 10 mg by mouth 2 (two) times daily.  ? FLOWFLEX COVID-19 AG HOME TEST KIT See admin instructions.  ? fluticasone (FLONASE) 50 MCG/ACT nasal spray Place into both nostrils.  ? hydrOXYzine (ATARAX/VISTARIL) 10 MG tablet Take 20 mg by mouth at bedtime.  ? ibuprofen (ADVIL) 800 MG tablet Take 800 mg by mouth 2 (two) times daily as needed.  ? montelukast (SINGULAIR) 10 MG tablet Take 1 tablet by mouth daily.  ? promethazine-dextromethorphan (PROMETHAZINE-DM) 6.25-15 MG/5ML syrup Take 5 mLs by mouth 4 (four) times  daily as needed.  ? sertraline (ZOLOFT) 100 MG tablet Take 100 mg by mouth daily.  ? traMADol (ULTRAM) 50 MG tablet Take 50 mg by mouth 3 (three) times daily.  ? traZODone (DESYREL) 50 MG tablet Take 50 mg by mouth at bedtime.  ? TRI-ESTARYLLA 0.18/0.215/0.25 MG-35 MCG tablet Take 1 tablet by mouth daily.  ? [DISCONTINUED] levothyroxine (SYNTHROID) 100 MCG tablet Take 1 tablet (100 mcg total) by mouth daily before breakfast.  ? levothyroxine (SYNTHROID) 100 MCG tablet Take 1 tablet (100 mcg total) by mouth daily before breakfast.  ? [DISCONTINUED] predniSONE (DELTASONE) 20 MG tablet Take 2 tablets (40 mg total) by mouth daily with breakfast.  ? [DISCONTINUED] sertraline (ZOLOFT) 50 MG tablet Take 50 mg by mouth daily.  ? ?No facility-administered encounter medications on file as of 03/23/2022.  ? ? ?ALLERGIES: ?Allergies  ?Allergen Reactions  ? Augmentin [Amoxicillin-Pot Clavulanate] Nausea And Vomiting  ? Prednisone Anxiety  ? ? ?VACCINATION STATUS: ? ?There is no immunization history on file for this patient. ? ? ?HPI ?Thyroid Problem ?Presents for follow-up visit. The condition has lasted for 3 months. Symptoms include weight gain. Patient reports no anxiety, cold intolerance, constipation, depressed mood, fatigue, leg swelling, palpitations or tremors. The symptoms have been stable. Past treatments include nothing. Risk  factors include family history of hyperthyroidism and family history of hypothyroidism.  ? ? ?Paula Holden is 26 y.o. female who presents today with a medical history as above. she is being seen in follow up after being seen in consultation for hyperthyroidism requested by Ocie Bob, FNP.   ? ?she denies dysphagia, choking, shortness of breath, no recent voice change.  ?  ?she does have family history of thyroid dysfunction in her cousin (Graves disease), maternal grandmother (Hypo), and great aunt (unknown), but denies family hx of thyroid cancer. she denies personal history of goiter.  she is not on any anti-thyroid medications nor on any thyroid hormone supplements. She does endorse use of Biotin containing supplements.  she is willing to proceed with appropriate work up and therapy for thyrotoxicosis. ? ?She had RAI ablation for Graves disease on 04/01/21. ? ?Review of systems ? ?Constitutional: + steadily increasing body weight,  current Body mass index is 31.54 kg/m?. , no fatigue, no subjective hyperthermia, no subjective hypothermia ?Eyes: no blurry vision, no xerophthalmia ?ENT: no sore throat, no nodules palpated in throat, no dysphagia/odynophagia, no hoarseness ?Cardiovascular: no chest pain, no shortness of breath, no palpitations, no leg swelling ?Respiratory: no cough, no shortness of breath ?Gastrointestinal: no nausea/vomiting/diarrhea ?Musculoskeletal: no muscle/joint aches ?Skin: no rashes, no hyperemia ?Neurological: no tremors, no numbness, no tingling, no dizziness ?Psychiatric: no depression, no anxiety, recently lost her mother unexpectantly ? ? ?Objective:  ?  ?BP 102/69   Pulse 69   Ht _0  (1.727 m)   Wt 207 lb 6.4 oz (94.1 kg)   SpO2 100%   BMI 31.54 kg/m?   ?Wt Readings from Last 3 Encounters:  ?03/23/22 207 lb 6.4 oz (94.1 kg)  ?12/23/21 200 lb 12.8 oz (91.1 kg)  ?09/13/21 197 lb 12.8 oz (89.7 kg)  ?  ? ?BP Readings from Last 3 Encounters:  ?03/23/22 102/69  ?01/14/22 117/79  ?12/23/21 111/76  ? ? ? ?Physical Exam- Limited ? ?Constitutional:  Body mass index is 31.54 kg/m?. , not in acute distress, normal state of mind ?Eyes:  EOMI, no exophthalmos ?Neck: Supple ?Cardiovascular: RRR, no murmurs, rubs, or gallops, no edema ?Respiratory: Adequate breathing efforts, no crackles, rales, rhonchi, or wheezing ?Musculoskeletal: no gross deformities, strength intact in all four extremities, no gross restriction of joint movements ?Skin:  no rashes, no hyperemia ?Neurological: no tremor with outstretched hands ? ? ?CMP  ?   ?Component Value Date/Time  ? NA 139 03/21/2022  0838  ? K 4.0 03/21/2022 0838  ? CL 106 03/21/2022 0838  ? CO2 20 03/21/2022 0838  ? GLUCOSE 82 03/21/2022 0838  ? BUN 8 03/21/2022 0838  ? CREATININE 0.77 03/21/2022 0838  ? CALCIUM 8.5 (L) 03/21/2022 9892  ? PROT 6.2 03/21/2022 0838  ? ALBUMIN 4.0 03/21/2022 0838  ? AST 12 03/21/2022 0838  ? ALT 11 03/21/2022 0838  ? ALKPHOS 70 03/21/2022 0838  ? BILITOT 0.5 03/21/2022 0838  ? ? ? ?CBC ?No results found for: WBC, RBC, HGB, HCT, PLT, MCV, MCH, MCHC, RDW, LYMPHSABS, MONOABS, EOSABS, BASOSABS ? ? ?Diabetic Labs (most recent): ?Lab Results  ?Component Value Date  ? HGBA1C 5.1 03/21/2022  ? ? ?Lipid Panel  ?No results found for: CHOL, TRIG, HDL, CHOLHDL, VLDL, LDLCALC, LDLDIRECT, LABVLDL ? ? ?Lab Results  ?Component Value Date  ? TSH 1.420 03/21/2022  ? TSH 4.240 12/22/2021  ? TSH 19.600 (H) 09/09/2021  ? TSH 42.800 (H) 07/15/2021  ? TSH 0.267 (L) 05/07/2021  ?  TSH 2.595 04/01/2021  ? TSH 2.260 03/19/2021  ? TSH 0.15 (A) 03/04/2021  ? FREET4 1.13 03/21/2022  ? FREET4 1.19 12/22/2021  ? FREET4 0.88 09/09/2021  ? FREET4 0.37 (L) 07/15/2021  ? FREET4 1.05 05/07/2021  ? FREET4 0.66 04/01/2021  ? FREET4 0.99 03/19/2021  ?  ? ?Results for KEIERRA, NUDO (MRN [id number]) as of 03/23/2021 08:19 ? Ref. Range 03/04/2021 00:00 03/19/2021 09:03 03/19/2021 09:32  ?TSH Latest Ref Range: 0.450 - 4.500 uIU/mL 0.15 (A)  2.260  ?T4,Free(Direct) Latest Ref Range: 0.82 - 1.77 ng/dL   0.99  ?Thyroperoxidase Ab SerPl-aCnc Latest Ref Range: 0 - 34 IU/mL   35 (H)  ?Thyroglobulin Antibody Latest Ref Range: 0.0 - 0.9 IU/mL   3.8 (H)  ? ?Uptake and scan 03/19/21 ? ?CLINICAL DATA:  Suppressed TSH suspicious for hyperthyroidism, neck ?swelling and tenderness, difficulty swallowing, heat intolerance, ?difficulty sleeping, hand tremors, irritability and mood venous, ?increased perspiration, heart palpitations, weakness, dry skin ?  ?EXAM: ?THYROID SCAN AND UPTAKE - 4 AND 24 HOURS ?  ?TECHNIQUE: ?Following oral administration of I-123 capsule, anterior  planar ?imaging was acquired at 24 hours. Thyroid uptake was calculated with ?a thyroid probe at 4-6 hours and 24 hours. ?  ?RADIOPHARMACEUTICALS:  304 uCi I-123 sodium iodide p.o. ?  ?COMPARISON:  None ?  ?FINDINGS:

## 2022-04-17 ENCOUNTER — Other Ambulatory Visit: Payer: Self-pay | Admitting: Nurse Practitioner

## 2022-04-17 DIAGNOSIS — E890 Postprocedural hypothyroidism: Secondary | ICD-10-CM

## 2022-05-12 ENCOUNTER — Emergency Department (HOSPITAL_COMMUNITY): Payer: No Typology Code available for payment source

## 2022-05-12 ENCOUNTER — Encounter (HOSPITAL_COMMUNITY): Payer: Self-pay | Admitting: Emergency Medicine

## 2022-05-12 ENCOUNTER — Emergency Department (HOSPITAL_COMMUNITY)
Admission: EM | Admit: 2022-05-12 | Discharge: 2022-05-12 | Disposition: A | Discharge status: Home / Self Care | Payer: No Typology Code available for payment source | Attending: Emergency Medicine | Admitting: Emergency Medicine

## 2022-05-12 ENCOUNTER — Other Ambulatory Visit: Payer: Self-pay

## 2022-05-12 DIAGNOSIS — W1839XA Other fall on same level, initial encounter: Secondary | ICD-10-CM | POA: Diagnosis not present

## 2022-05-12 DIAGNOSIS — M25512 Pain in left shoulder: Secondary | ICD-10-CM | POA: Insufficient documentation

## 2022-05-12 HISTORY — DX: Thyrotoxicosis with diffuse goiter without thyrotoxic crisis or storm: E05.00

## 2022-05-12 MED ORDER — HYDROCODONE-ACETAMINOPHEN 5-325 MG PO TABS: 1.0000 | ORAL_TABLET | Freq: Four times a day (QID) | ORAL | 0 refills | Status: DC | PRN

## 2022-05-12 MED ORDER — METHOCARBAMOL 500 MG PO TABS: 500.0000 mg | ORAL_TABLET | Freq: Two times a day (BID) | ORAL | 0 refills | Status: DC

## 2022-05-12 NOTE — Discharge Instructions (Addendum)
You were seen in the emergency department today for shoulder pain.  As we discussed your x-ray looked reassuring.  It is possible that you injured your shoulder with your fall several weeks ago, as well as some overuse recently.  I am prescribing you a short course of pain medication, as well as a muscle relaxer.  Please only take the pain medication for severe pain.  You can also be taking ibuprofen as needed.  I have attached the contact information for the orthopedic doctor, for you to call and make a follow-up appointment if your symptoms persist.

## 2022-05-12 NOTE — ED Provider Notes (Signed)
[facility] EMERGENCY DEPARTMENT Provider Note   CSN: [id number] Arrival date & time: 05/12/22  1937     History  Chief Complaint  Patient presents with   Shoulder Pain    Paula Holden is a 26 y.o. female who presents the emergency department complaining of left shoulder pain for the past week.  Patient states that she had a fall 2 weeks ago, but does not believe she landed on her shoulder.  Her pain got significantly worse today while she was at work.  She works as a Occupational psychologist.  She denies any numbness or tingling.  States that she has been taking ibuprofen and her prescribed tramadol without relief.   Shoulder Pain     Home Medications Prior to Admission medications   Medication Sig Start Date End Date Taking? Authorizing Provider  HYDROcodone-acetaminophen (NORCO/VICODIN) 5-325 MG tablet Take 1 tablet by mouth every 6 (six) hours as needed for severe pain. 05/12/22  Yes Makel Mcmann T, PA-C  methocarbamol (ROBAXIN) 500 MG tablet Take 1 tablet (500 mg total) by mouth 2 (two) times daily. 05/12/22  Yes Cassadee Vanzandt T, PA-C  buPROPion (WELLBUTRIN SR) 200 MG 12 hr tablet Take 200 mg by mouth 2 (two) times daily. 09/10/21   [provider]  busPIRone (BUSPAR) 10 MG tablet Take 10 mg by mouth 2 (two) times daily. 12/01/20   [provider]  [place] TEST KIT See admin instructions. 03/02/22   [provider]  fluticasone (FLONASE) 50 MCG/ACT nasal spray Place into both nostrils. 11/12/20   [provider]  hydrOXYzine (ATARAX/VISTARIL) 10 MG tablet Take 20 mg by mouth at bedtime. 01/30/21   [provider]  ibuprofen (ADVIL) 800 MG tablet Take 800 mg by mouth 2 (two) times daily as needed. 10/12/20   [provider]  levothyroxine (SYNTHROID) 100 MCG tablet TAKE 1 TABLET BY MOUTH DAILY BEFORE BREAKFAST. 04/18/22   Reardon, Juanetta Beets, NP  montelukast (SINGULAIR) 10 MG tablet Take 1 tablet by mouth daily. 03/18/21    [provider]  promethazine-dextromethorphan (PROMETHAZINE-DM) 6.25-15 MG/5ML syrup Take 5 mLs by mouth 4 (four) times daily as needed. 01/14/22   Volney American, PA-C  sertraline (ZOLOFT) 100 MG tablet Take 100 mg by mouth daily. 02/25/22   [provider]  traMADol (ULTRAM) 50 MG tablet Take 50 mg by mouth 3 (three) times daily. 04/07/21   [provider]  traZODone (DESYREL) 50 MG tablet Take 50 mg by mouth at bedtime. 07/15/21   [provider]  TRI-ESTARYLLA 0.18/0.215/0.25 MG-35 MCG tablet Take 1 tablet by mouth daily. 07/23/21   [provider]      Allergies    Augmentin [amoxicillin-pot clavulanate] and Prednisone    Review of Systems   Review of Systems  Musculoskeletal:  Positive for arthralgias.  Neurological:  Negative for weakness and numbness.  All other systems reviewed and are negative.  Physical Exam Updated Vital Signs BP 122/62 (BP Location: Right Arm)   Pulse 69   Temp 97.9 F (36.6 C) (Oral)   Resp 17   LMP 04/28/2022 (Approximate)   SpO2 100%  Physical Exam Vitals and nursing note reviewed.  Constitutional:      Appearance: Normal appearance.  HENT:     Head: Normocephalic and atraumatic.  Eyes:     Conjunctiva/sclera: Conjunctivae normal.  Pulmonary:     Effort: Pulmonary effort is normal. No respiratory distress.  Musculoskeletal:     Comments: Normal passive ROM with  pain at extremes of shoulder flexion and abduction.  4/5 strength due to pain.  Normal elbow range of motion.  Muscular tension noted over the left trapezius muscle.  Neck is supple, with normal range of motion.  No deformities noted to the shoulder or the neck.  Skin:    General: Skin is warm and dry.  Neurological:     Mental Status: She is alert.  Psychiatric:        Mood and Affect: Mood normal.        Behavior: Behavior normal.    ED Results / Procedures / Treatments   Labs (all labs ordered are listed, but only abnormal  results are displayed) Labs Reviewed - No data to display  EKG None  Radiology DG Shoulder Left  Result Date: 05/12/2022 CLINICAL DATA:  Left shoulder pain.  Fall EXAM: LEFT SHOULDER - 2+ VIEW COMPARISON:  None Available. FINDINGS: There is no evidence of fracture or dislocation. There is no evidence of arthropathy or other focal bone abnormality. Soft tissues are unremarkable. IMPRESSION: Negative. Electronically Signed   By: Rolm Baptise M.D.   On: 05/12/2022 20:06    Procedures Procedures    Medications Ordered in ED Medications - No data to display  ED Course/ Medical Decision Making/ A&P                           Medical Decision Making Amount and/or Complexity of Data Reviewed Radiology: ordered.  Risk Prescription drug management.  This patient is a 26 y.o. female  who presents to the ED for concern of left shoulder pain.   Differential diagnoses prior to evaluation: The emergent differential diagnosis includes, but is not limited to, acute fracture dislocation, ligamentous injury, muscle strain, cervical radiculopathy, costochondritis.  This is not an exhaustive differential.   Past Medical History / Co-morbidities: Graves' disease, migraines  Additional history: Chart reviewed. Pertinent results include: PDMP reviewed.  Physical Exam: Physical exam performed. The pertinent findings include: Preserved passive ROM of the left shoulder, decreased strength due to pain.  No deformity noted to the shoulder.  Muscular tension over the left trapezius.  Neck supple with no stiffness.  Lab Tests/Imaging studies: I Ordered, and personally interpreted labs/imaging and the pertinent results include: Left shoulder x-ray showed no acute fractures dislocations. I agree with the radiologist interpretation.   Disposition: After consideration of the diagnostic results and the patients response to treatment, I feel that patient's not requiring admission.  I offered steroids to help  with inflammation, and patient declines this states that she has had previous poor reactions to steroids.  I suspect her pain is likely related to overuse or possible soft tissue injury from her fall several weeks ago.  I encouraged topical creams, continue ibuprofen, and will also write prescriptions for short course of pain medication and muscle relaxer.  Provided contact information for orthopedics for patient to follow-up if her symptoms persist.. Discussed reasons to return to the emergency department, and the patient is agreeable to the plan.   Final Clinical Impression(s) / ED Diagnoses Final diagnoses:  Acute pain of left shoulder    Rx / DC Orders ED Discharge Orders          Ordered    HYDROcodone-acetaminophen (NORCO/VICODIN) 5-325 MG tablet  Every 6 hours PRN        05/12/22 2207    methocarbamol (ROBAXIN) 500 MG tablet  2 times daily  05/12/22 2207           Portions of this report may have been transcribed using voice recognition software. Every effort was made to ensure accuracy; however, inadvertent computerized transcription errors may be present.    Estill Cotta 05/12/22 2332    Daleen Bo, MD 05/13/22 [phone]

## 2022-05-12 NOTE — ED Triage Notes (Signed)
Pt c/o left shoulder pain x 3 days. Fell x 2 weeks ago. Denies radiation, numbness or tingling. ROM limited due to pain. Has taken ibuprofen and tramadol with no relief. Nad. No obvious deformity noted.

## 2022-05-25 ENCOUNTER — Encounter: Payer: Self-pay | Admitting: Orthopedic Surgery

## 2022-05-25 ENCOUNTER — Ambulatory Visit (INDEPENDENT_AMBULATORY_CARE_PROVIDER_SITE_OTHER): Payer: No Typology Code available for payment source | Admitting: Orthopedic Surgery

## 2022-05-25 ENCOUNTER — Ambulatory Visit (INDEPENDENT_AMBULATORY_CARE_PROVIDER_SITE_OTHER): Payer: No Typology Code available for payment source

## 2022-05-25 VITALS — BP 130/80 | HR 82 | Ht 68.0 in | Wt 204.0 lb

## 2022-05-25 DIAGNOSIS — F332 Major depressive disorder, recurrent severe without psychotic features: Secondary | ICD-10-CM | POA: Insufficient documentation

## 2022-05-25 DIAGNOSIS — M62838 Other muscle spasm: Secondary | ICD-10-CM

## 2022-05-25 DIAGNOSIS — M542 Cervicalgia: Secondary | ICD-10-CM | POA: Diagnosis not present

## 2022-05-25 DIAGNOSIS — E0500 Thyrotoxicosis with diffuse goiter without thyrotoxic crisis or storm: Secondary | ICD-10-CM | POA: Insufficient documentation

## 2022-05-25 DIAGNOSIS — F4310 Post-traumatic stress disorder, unspecified: Secondary | ICD-10-CM | POA: Insufficient documentation

## 2022-05-25 DIAGNOSIS — F419 Anxiety disorder, unspecified: Secondary | ICD-10-CM | POA: Insufficient documentation

## 2022-05-25 DIAGNOSIS — F32A Depression, unspecified: Secondary | ICD-10-CM | POA: Insufficient documentation

## 2022-05-25 MED ORDER — METHOCARBAMOL 500 MG PO TABS: 500.0000 mg | ORAL_TABLET | Freq: Three times a day (TID) | ORAL | 1 refills | Status: DC

## 2022-05-25 NOTE — Progress Notes (Signed)
NEW PROBLEM//OFFICE VISIT   Chief Complaint  Patient presents with   Shoulder Pain    Left / fell about a month ago    Paula Holden is a 26 y.o. female who presents the emergency department complaining of left shoulder pain for the past week.  Patient states that she had a fall 2 weeks ago, but does not believe she landed on her shoulder.  Her pain got significantly worse today while she was at work.  She works as a Associate Professor.  She denies any numbness or tingling.  States that she has been taking ibuprofen and her prescribed tramadol without relief.  C/O PAIN LEFT TRAP AND LOWER C SPINE   MEDICATIONS: IBUPROFEN 8 HRS ROBAXIN MR HS AND THE PAIN MEDS ON OCCASION     Paula Holden confirms the history noted above.  She fell going up the steps landed on both outstretched arms did not have pain until 2 weeks later.  Although she complains of shoulder pain her pain is in her trapezius muscle and lower cervical spine.  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  General appearance: Well-developed well-nourished no gross deformities  Cardiovascular normal pulse and perfusion normal color without edema  Neurologically no sensation loss or deficits or pathologic reflexes  Psychological: Awake alert and oriented x3 mood and affect normal  Skin no lacerations or ulcerations no nodularity no palpable masses, no erythema or nodularity  Musculoskeletal: Grip strength is normal  Active and passive range of motion of the shoulder revealed that  she has pain when she gets to about 150 degrees of flexion but the pain is in the trapezius muscle  Cervical spine shows decreased global motion in all planes especially extension she has increased muscle tension in the left trapezius muscle and pain along the medial border of the scapula to palpation.  No radicular symptoms Spurling negative Lhermitte sign negative   Past Medical History:  Diagnosis Date   Graves disease     Past Surgical History:  Procedure Laterality Date   ADENOIDECTOMY     TONSILLECTOMY      Family History  Problem Relation Age of Onset   Hyperlipidemia Mother    Thyroid disease Maternal Grandmother    Social History   Tobacco Use   Smoking status: Never   Smokeless tobacco: Never  Vaping Use   Vaping Use: Never used  Substance Use Topics   Alcohol use: Yes    Comment: Social   Drug use: Never    Allergies  Allergen Reactions   Augmentin [Amoxicillin-Pot Clavulanate] Nausea And Vomiting   Prednisone Anxiety    Current Meds  Medication Sig   buPROPion (WELLBUTRIN SR) 200 MG 12 hr tablet Take 200 mg by mouth 2 (two) times daily.   busPIRone (BUSPAR) 10 MG tablet Take 10 mg by mouth 2 (two) times daily.   cyclobenzaprine (FLEXERIL) 5 MG tablet Take 5 mg by mouth 3 (  three) times daily as needed.   escitalopram (LEXAPRO) 5 MG tablet Take 10 mg by mouth daily.   fluticasone (FLONASE) 50 MCG/ACT nasal spray Place into both nostrils.   HYDROcodone-acetaminophen (NORCO/VICODIN) 5-325 MG tablet Take 1 tablet by mouth every 6 (six) hours as needed for severe pain.   hydrOXYzine (ATARAX/VISTARIL) 10 MG tablet Take 20 mg by mouth at bedtime.   ibuprofen (ADVIL) 800 MG tablet Take 800 mg by mouth 2 (two) times daily as needed.   levothyroxine (SYNTHROID) 100 MCG tablet TAKE 1 TABLET BY MOUTH DAILY BEFORE BREAKFAST.   methocarbamol (ROBAXIN) 500 MG tablet Take 1 tablet (500 mg total) by mouth 3 (three) times daily.   montelukast (SINGULAIR) 10 MG tablet Take  1 tablet by mouth daily.   promethazine-dextromethorphan (PROMETHAZINE-DM) 6.25-15 MG/5ML syrup Take 5 mLs by mouth 4 (four) times daily as needed.   traMADol (ULTRAM) 50 MG tablet Take 50 mg by mouth 3 (three) times daily.   traZODone (DESYREL) 50 MG tablet Take 50 mg by mouth at bedtime.   TRI-ESTARYLLA 0.18/0.215/0.25 MG-35 MCG tablet Take 1 tablet by mouth daily.   [DISCONTINUED] methocarbamol (ROBAXIN) 500 MG tablet Take 1 tablet (500 mg total) by mouth 2 (two) times daily.     MEDICAL DECISION MAKING  A.  Encounter Diagnoses  Name Primary?   Neck pain Yes   Cervicalgia    Muscle spasms of neck     B. DATA ANALYSED:   IMAGING: Interpretation of images: I have personally reviewed the images and my interpretation is the shoulder x-ray from the emergency room shows no fracture dislocation or bone lesion  Outside records reviewed: ER records were reviewed.   C. MANAGEMENT   This seems to be more of a neck problem than a shoulder problem.  Currently there are no neurologic signs.  I am going to favor the muscle spasm cervicalgia diagnosis over a disc problem at this time  Recommend she continue with her nighttime muscle relaxer use a heating pad.  Take her anti-inflammatories and if she is amenable to it physical therapy  6-week follow-up  Meds ordered this encounter  Medications   methocarbamol (ROBAXIN) 500 MG tablet    Sig: Take 1 tablet (500 mg total) by mouth 3 (three) times daily.    Dispense:  60 tablet    Refill:  1   OOW NOTE   Paula Canada, MD  05/25/2022 11:15 AM

## 2022-07-06 ENCOUNTER — Encounter: Payer: Self-pay | Admitting: Orthopedic Surgery

## 2022-07-06 ENCOUNTER — Ambulatory Visit (INDEPENDENT_AMBULATORY_CARE_PROVIDER_SITE_OTHER): Payer: No Typology Code available for payment source | Admitting: Orthopedic Surgery

## 2022-07-06 DIAGNOSIS — M542 Cervicalgia: Secondary | ICD-10-CM | POA: Diagnosis not present

## 2022-07-06 DIAGNOSIS — M62838 Other muscle spasm: Secondary | ICD-10-CM | POA: Diagnosis not present

## 2022-07-06 MED ORDER — NABUMETONE 500 MG PO TABS: 500.0000 mg | ORAL_TABLET | Freq: Two times a day (BID) | ORAL | 5 refills | Status: DC

## 2022-07-06 MED ORDER — METAXALONE 800 MG PO TABS: 800.0000 mg | ORAL_TABLET | Freq: Three times a day (TID) | ORAL | 1 refills | Status: DC

## 2022-07-06 MED ORDER — CYCLOBENZAPRINE HCL 5 MG PO TABS: 5.0000 mg | ORAL_TABLET | Freq: Three times a day (TID) | ORAL | 3 refills | Status: DC | PRN

## 2022-07-06 NOTE — Progress Notes (Signed)
Chief Complaint  Patient presents with   Neck Pain    Needs PT order for [facility], she did not get a call from [facility] to schedule therapy    She also comes in after her initial visit in June 14 after fall injured her left shoulder and neck.  About 2 weeks after the fall she started having pain in the upper trapezius muscle.  She did have a trial of tramadol ibuprofen but did not improve  We changed her medication to include Robaxin but she still says she is not getting good relief  We did schedule her for physical therapy she just started that  She says she is a little better.  She ran out of the hydrocodone said it was not strong enough probably to get good relief  However I discouraged her from taking opioids for musculoskeletal conditions  We decided to change her medications  Examination shows she has pain in the upper trapezius muscle some in the midline she has passive normal range of motion the left shoulder with all of her pain being in the trap  Recommend changing her medications as noted below physical therapy follow-up 6 weeks Meds ordered this encounter  Medications   metaxalone (SKELAXIN) 800 MG tablet    Sig: Take 1 tablet (800 mg total) by mouth 3 (three) times daily.    Dispense:  60 tablet    Refill:  1   cyclobenzaprine (FLEXERIL) 5 MG tablet    Sig: Take 1 tablet (5 mg total) by mouth 3 (three) times daily as needed.    Dispense:  30 tablet    Refill:  3   nabumetone (RELAFEN) 500 MG tablet    Sig: Take 1 tablet (500 mg total) by mouth 2 (two) times daily.    Dispense:  60 tablet    Refill:  5

## 2022-07-25 ENCOUNTER — Ambulatory Visit: Payer: 59 | Admitting: Nurse Practitioner

## 2022-08-17 ENCOUNTER — Ambulatory Visit: Payer: No Typology Code available for payment source | Admitting: Orthopedic Surgery

## 2022-08-22 ENCOUNTER — Ambulatory Visit
Admission: EM | Admit: 2022-08-22 | Discharge: 2022-08-22 | Disposition: A | Discharge status: Home / Self Care | Payer: No Typology Code available for payment source | Attending: Nurse Practitioner | Admitting: Nurse Practitioner

## 2022-08-22 DIAGNOSIS — J309 Allergic rhinitis, unspecified: Secondary | ICD-10-CM

## 2022-08-22 MED ORDER — PSEUDOEPH-BROMPHEN-DM 30-2-10 MG/5ML PO SYRP: 5.0000 mL | ORAL_SOLUTION | Freq: Four times a day (QID) | ORAL | 0 refills | Status: DC | PRN

## 2022-08-22 MED ORDER — PREDNISONE 20 MG PO TABS: 40.0000 mg | ORAL_TABLET | Freq: Every day | ORAL | 0 refills | Status: AC

## 2022-08-22 NOTE — ED Triage Notes (Signed)
Pt reports on and off sore throat, light headed, nausea, dizzy, ear pain x 3 weeks. Sudafed gives no relief.

## 2022-08-22 NOTE — Discharge Instructions (Addendum)
Recommend switching to Claritin-D for your allergy symptoms.  If you start this medication, you can stop the Sudafed.  Continue Flonase at this time. Take medication as prescribed.  Take the prednisone at breakfast to help decrease side effects of anxiety. Increase fluids and allow for plenty of rest. May take over-the-counter Tylenol or ibuprofen as needed for pain or discomfort. Warm salt water gargles 3-4 times daily as needed for throat pain or discomfort. Avoid allergy triggers as much as possible while symptoms persist. Follow-up with your primary care physician if symptoms fail to improve.

## 2022-08-22 NOTE — ED Provider Notes (Signed)
[facility]    CSN: [id number] Arrival date & time: 08/22/22  1347      History   Chief Complaint Chief Complaint  Patient presents with   Sore Throat   Nausea   Otalgia        Dizziness    HPI Paula Holden is a 26 y.o. female.   The history is provided by the patient.   Patient with a 3-week history of sore throat, lightheadedness, dizziness, bilateral ear pain, and nausea.  Patient states that she has an underlying history of seasonal allergies.  She reports that her sore throat is worse at night and in the morning, then improves throughout the day.  She also complains of nausea.  Patient states that she has pain in both her ears, pain is worse on the left side.  She denies fever, chills, ear drainage, wheezing, shortness of breath, difficulty breathing, or GI symptoms.  Patient states she has been taking Zyrtec, Sudafed, and using Flonase with minimal relief.  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      Home Medications    Prior to Admission medications   Medication Sig Start Date End Date Taking? Authorizing Provider  brompheniramine-pseudoephedrine-DM 30-2-10 MG/5ML syrup Take 5 mLs by mouth 4 (four) times daily as needed. 08/22/22  Yes Chasty Randal-Warren, Sadie Haber, NP  predniSONE (DELTASONE) 20 MG tablet Take 2 tablets (40 mg total) by mouth daily with breakfast for 5 days. 08/22/22 08/27/22 Yes Savana Spina-Warren, Sadie Haber, NP  buPROPion (WELLBUTRIN SR) 200 MG 12 hr tablet Take 200 mg by mouth 2 (two) times daily. 09/10/21   [provider]  busPIRone (BUSPAR) 10 MG tablet Take 10 mg by mouth 2 (two) times daily. 12/01/20   [provider]  cyclobenzaprine (FLEXERIL) 5 MG tablet Take 1 tablet (5 mg total) by mouth 3 (three) times daily as needed. 07/06/22   Vickki Hearing, MD  escitalopram (LEXAPRO) 5 MG tablet Take 10 mg by mouth daily. 04/21/22   [provider]  fluticasone (FLONASE) 50 MCG/ACT nasal spray Place into both nostrils. 11/12/20   [provider]  HYDROcodone-acetaminophen (NORCO/VICODIN) 5-325 MG tablet Take 1 tablet by mouth every 6 (six) hours as needed for severe pain. 05/12/22   Roemhildt, Lorin T, PA-C  hydrOXYzine (ATARAX/VISTARIL) 10 MG tablet Take 20 mg by mouth at bedtime. 01/30/21   [provider]  ibuprofen (ADVIL) 800 MG tablet Take 800 mg by mouth 2 (two) times daily as needed. 10/12/20   [provider]  levothyroxine (SYNTHROID) 100 MCG tablet TAKE 1 TABLET BY MOUTH DAILY BEFORE BREAKFAST. 04/18/22   Dani Gobble, NP  metaxalone (SKELAXIN) 800 MG tablet Take 1 tablet (800 mg total) by mouth 3 (three) times daily. 07/06/22   Vickki Hearing, MD  montelukast (SINGULAIR) 10 MG tablet Take 1 tablet by mouth daily. 03/18/21   [provider]  nabumetone (RELAFEN) 500 MG tablet Take 1 tablet (500 mg total) by mouth 2 (two) times daily. 07/06/22   Fuller Canada  E, MD  promethazine-dextromethorphan (PROMETHAZINE-DM) 6.25-15 MG/5ML syrup Take 5 mLs by mouth 4 (four) times daily as needed. 01/14/22   Particia Nearing, PA-C  traMADol (ULTRAM) 50 MG tablet Take 50 mg by mouth 3 (three) times daily. 04/07/21   [provider]  traZODone (DESYREL) 50 MG tablet Take 50 mg by mouth at bedtime. 07/15/21   [provider]  TRI-ESTARYLLA 0.18/0.215/0.25 MG-35 MCG tablet Take 1 tablet by mouth daily. 07/23/21   [provider]    Family History Family History  Problem Relation Age of Onset   Hyperlipidemia Mother     Thyroid disease Maternal Grandmother     Social History Social History   Tobacco Use   Smoking status: Never   Smokeless tobacco: Never  Vaping Use   Vaping Use: Never used  Substance Use Topics   Alcohol use: Yes    Comment: Social   Drug use: Never     Allergies   Augmentin [amoxicillin-pot clavulanate] and Prednisone   Review of Systems Review of Systems Per HPI  Physical Exam Triage Vital Signs ED Triage Vitals  Enc Vitals Group     BP 08/22/22 1457 119/85     Pulse Rate 08/22/22 1457 86     Resp 08/22/22 1457 16     Temp 08/22/22 1457 99 F (37.2 C)     Temp Source 08/22/22 1457 Oral     SpO2 08/22/22 1457 97 %     Weight --      Height --      Head Circumference --      Peak Flow --      Pain Score 08/22/22 1450 8     Pain Loc --      Pain Edu? --      Excl. in GC? --    No data found.  Updated Vital Signs BP 119/85 (BP Location: Right Arm)   Pulse 86   Temp 99 F (37.2 C) (Oral)   Resp 16   SpO2 97%   Visual Acuity Right Eye Distance:   Left Eye Distance:   Bilateral Distance:    Right Eye Near:   Left Eye Near:    Bilateral Near:     Physical Exam Vitals and nursing note reviewed.  Constitutional:      General: She is not in acute distress.    Appearance: Normal appearance. She is well-developed.  HENT:     Head: Normocephalic and atraumatic.     Right Ear: Tympanic membrane, ear canal and external ear normal.     Left Ear: Ear canal and external ear normal. A middle ear effusion is present.     Nose: Congestion present.     Right Turbinates: Enlarged and swollen.     Left Turbinates: Enlarged and swollen.     Right Sinus: No maxillary sinus tenderness or frontal sinus tenderness.     Left Sinus: No maxillary sinus tenderness or frontal sinus tenderness.     Mouth/Throat:     Mouth: Mucous membranes are moist.     Pharynx: Posterior oropharyngeal erythema present.  Eyes:     Extraocular Movements: Extraocular movements intact.      Conjunctiva/sclera: Conjunctivae normal.     Pupils: Pupils are equal, round, and reactive to light.  Neck:     Thyroid: No thyromegaly.     Trachea: No tracheal deviation.  Cardiovascular:     Rate and Rhythm: Normal rate and regular rhythm.     Heart sounds:  Normal heart sounds.  Pulmonary:     Effort: Pulmonary effort is normal.     Breath sounds: Normal breath sounds.  Abdominal:     General: Bowel sounds are normal. There is no distension.     Palpations: Abdomen is soft.     Tenderness: There is no abdominal tenderness.  Musculoskeletal:     Cervical back: Normal range of motion and neck supple.  Skin:    General: Skin is warm and dry.  Neurological:     General: No focal deficit present.     Mental Status: She is alert and oriented to person, place, and time.  Psychiatric:        Mood and Affect: Mood normal.        Behavior: Behavior normal.        Thought Content: Thought content normal.        Judgment: Judgment normal.      UC Treatments / Results  Labs (all labs ordered are listed, but only abnormal results are displayed) Labs Reviewed - No data to display  EKG   Radiology No results found.  Procedures Procedures (including critical care time)  Medications Ordered in UC Medications - No data to display  Initial Impression / Assessment and Plan / UC Course  I have reviewed the triage vital signs and the nursing notes.  Pertinent labs & imaging results that were available during my care of the patient were reviewed by me and considered in my medical decision making (see chart for details).  Patient presents with a 3-week history of upper respiratory symptoms.  On exam, patient's vital signs are stable, she is in no acute distress.  Patient's lung sounds are clear throughout.  Patient does not exhibit any symptoms of a bacterial infection at this time.  Patient reports symptoms have waxed and waned since the onset.  Symptoms appear to be consistent  with allergic rhinitis at this time.  Patient is currently taking Zyrtec and has been for quite some time, recommended that she switch to a another allergy medication such as Claritin-D.  Patient advised to continue the fluticasone.  For her middle ear effusion, will prescribe a short course of prednisone to help with swelling of the eustachian tube.  We will also provide Bromfed for her cough symptoms and allergic rhinitis symptoms.  COVID testing or other point-of-care testing is not indicated at this time based on the duration of the patient's symptoms.  Supportive care recommendations were provided to the patient.  Patient verbalizes understanding.  All questions were answered. Final Clinical Impressions(s) / UC Diagnoses   Final diagnoses:  Allergic rhinitis, unspecified seasonality, unspecified trigger     Discharge Instructions      Recommend switching to Claritin-D for your allergy symptoms.  If you start this medication, you can stop the Sudafed.  Continue Flonase at this time. Take medication as prescribed.  Take the prednisone at breakfast to help decrease side effects of anxiety. Increase fluids and allow for plenty of rest. May take over-the-counter Tylenol or ibuprofen as needed for pain or discomfort. Warm salt water gargles 3-4 times daily as needed for throat pain or discomfort. Avoid allergy triggers as much as possible while symptoms persist. Follow-up with your primary care physician if symptoms fail to improve.      ED Prescriptions     Medication Sig Dispense Auth. Provider   predniSONE (DELTASONE) 20 MG tablet Take 2 tablets (40 mg total) by mouth daily with breakfast for 5 days.  10 tablet Jeovani Weisenburger-Warren, Sadie Haber, NP   brompheniramine-pseudoephedrine-DM 30-2-10 MG/5ML syrup Take 5 mLs by mouth 4 (four) times daily as needed. 140 mL Peyten Punches-Warren, Sadie Haber, NP      PDMP not reviewed this encounter.   Abran Cantor, NP 08/22/22 1521

## 2022-08-24 LAB — TSH: TSH: 1.65 (ref 0.41–5.90)

## 2022-09-05 ENCOUNTER — Ambulatory Visit (INDEPENDENT_AMBULATORY_CARE_PROVIDER_SITE_OTHER): Payer: No Typology Code available for payment source | Admitting: Orthopedic Surgery

## 2022-09-05 ENCOUNTER — Encounter: Payer: Self-pay | Admitting: Orthopedic Surgery

## 2022-09-05 DIAGNOSIS — M62838 Other muscle spasm: Secondary | ICD-10-CM | POA: Diagnosis not present

## 2022-09-05 DIAGNOSIS — M542 Cervicalgia: Secondary | ICD-10-CM | POA: Diagnosis not present

## 2022-09-05 MED ORDER — CYCLOBENZAPRINE HCL 5 MG PO TABS: 5.0000 mg | ORAL_TABLET | Freq: Three times a day (TID) | ORAL | 3 refills | Status: AC | PRN

## 2022-09-05 MED ORDER — NABUMETONE 500 MG PO TABS: 500.0000 mg | ORAL_TABLET | Freq: Two times a day (BID) | ORAL | 5 refills | Status: AC

## 2022-09-05 MED ORDER — METAXALONE 800 MG PO TABS: 800.0000 mg | ORAL_TABLET | Freq: Three times a day (TID) | ORAL | 1 refills | Status: DC

## 2022-09-05 NOTE — Progress Notes (Signed)
FOLLOW UP   Encounter Diagnoses  Name Primary?   Neck pain Yes   Muscle spasms of neck    Cervicalgia      Chief Complaint  Patient presents with   Neck Pain    Better, has had to put PT on hold due to work Schedule, but not as tight, doing home exercises helps   Allergies  Allergen Reactions   Augmentin [Amoxicillin-Pot Clavulanate] Nausea And Vomiting   Prednisone Anxiety   Current Outpatient Medications  Medication Instructions   brompheniramine-pseudoephedrine-DM 30-2-10 MG/5ML syrup 5 mLs, Oral, 4 times daily PRN   buPROPion (WELLBUTRIN SR) 200 mg, Oral, 2 times daily   busPIRone (BUSPAR) 10 mg, Oral, 2 times daily   cyclobenzaprine (FLEXERIL) 5 mg, Oral, 3 times daily PRN   escitalopram (LEXAPRO) 10 mg, Oral, Daily   fluticasone (FLONASE) 50 MCG/ACT nasal spray Each Nare   HYDROcodone-acetaminophen (NORCO/VICODIN) 5-325 MG tablet 1 tablet, Oral, Every 6 hours PRN   hydrOXYzine (ATARAX) 20 mg, Oral, Daily at bedtime   ibuprofen (ADVIL) 800 mg, Oral, 2 times daily PRN   levothyroxine (SYNTHROID) 100 MCG tablet TAKE 1 TABLET BY MOUTH DAILY BEFORE BREAKFAST.   metaxalone (SKELAXIN) 800 mg, Oral, 3 times daily   montelukast (SINGULAIR) 10 MG tablet 1 tablet, Oral, Daily   nabumetone (RELAFEN) 500 mg, Oral, 2 times daily   promethazine-dextromethorphan (PROMETHAZINE-DM) 6.25-15 MG/5ML syrup 5 mLs, Oral, 4 times daily PRN   traMADol (ULTRAM) 50 mg, Oral, 3 times daily   traZODone (DESYREL) 50 mg, Oral, Daily at bedtime   TRI-ESTARYLLA 0.18/0.215/0.25 MG-35 MCG tablet 1 tablet, Oral, Daily     This is a 26 year old female who sustained a fall on June 14 injuring her left shoulder and neck she presented with pain in her upper trapezius muscle and muscle spasms she had a trial of tramadol and ibuprofen did not improve she was switched to Robaxin but did not get good relief she eventually was sent for physical therapy on Skelaxin and nabumetone with some Flexeril at night to help  her sleep  The patient has improved with physical therapy.  She is now on a home exercise program.  She got good relief from the Skelaxin and uses the medication occasionally when needed.  Her range of motion has returned to normal.  We will refill and keep her on the same medications Flexeril at night if needed nabumetone and Skelaxin  She will follow-up as needed  Meds ordered this encounter  Medications   cyclobenzaprine (FLEXERIL) 5 MG tablet    Sig: Take 1 tablet (5 mg total) by mouth 3 (three) times daily as needed.    Dispense:  30 tablet    Refill:  3   nabumetone (RELAFEN) 500 MG tablet    Sig: Take 1 tablet (500 mg total) by mouth 2 (two) times daily.    Dispense:  60 tablet    Refill:  5   metaxalone (SKELAXIN) 800 MG tablet    Sig: Take 1 tablet (800 mg total) by mouth 3 (three) times daily.    Dispense:  60 tablet    Refill:  1

## 2022-09-20 NOTE — Patient Instructions (Signed)

## 2022-09-21 ENCOUNTER — Encounter: Payer: Self-pay | Admitting: Nurse Practitioner

## 2022-09-21 ENCOUNTER — Ambulatory Visit (INDEPENDENT_AMBULATORY_CARE_PROVIDER_SITE_OTHER): Payer: No Typology Code available for payment source | Admitting: Nurse Practitioner

## 2022-09-21 VITALS — BP 127/77 | HR 76 | Ht 68.0 in | Wt 214.0 lb

## 2022-09-21 DIAGNOSIS — E890 Postprocedural hypothyroidism: Secondary | ICD-10-CM

## 2022-09-21 MED ORDER — LEVOTHYROXINE SODIUM 100 MCG PO TABS: 100.0000 ug | ORAL_TABLET | Freq: Every day | ORAL | 1 refills | Status: DC

## 2022-09-21 NOTE — Progress Notes (Signed)
09/21/2022     Endocrinology Follow Up Note     Subjective:    Patient ID: Paula Holden, female    DOB: 31-Oct-1996, PCP Ocie Bob, FNP.   Past Medical History:  Diagnosis Date   Graves disease     Past Surgical History:  Procedure Laterality Date   ADENOIDECTOMY     TONSILLECTOMY      Social History   Socioeconomic History   Marital status: Single    Spouse name: Not on file   Number of children: Not on file   Years of education: Not on file   Highest education level: Not on file  Occupational History   Not on file  Tobacco Use   Smoking status: Never   Smokeless tobacco: Never  Vaping Use   Vaping Use: Never used  Substance and Sexual Activity   Alcohol use: Yes    Comment: Social   Drug use: Never   Sexual activity: Not Currently    Birth control/protection: Pill  Other Topics Concern   Not on file  Social History Narrative   Not on file   Social Determinants of Health   Financial Resource Strain: Not on file  Food Insecurity: Not on file  Transportation Needs: Not on file  Physical Activity: Not on file  Stress: Not on file  Social Connections: Not on file    Family History  Problem Relation Age of Onset   Hyperlipidemia Mother    Thyroid disease Maternal Grandmother     Outpatient Encounter Medications as of 09/21/2022  Medication Sig   brompheniramine-pseudoephedrine-DM 30-2-10 MG/5ML syrup Take 5 mLs by mouth 4 (four) times daily as needed.   buPROPion (WELLBUTRIN SR) 200 MG 12 hr tablet Take 200 mg by mouth 2 (two) times daily.   busPIRone (BUSPAR) 10 MG tablet Take 10 mg by mouth 2 (two) times daily.   cyclobenzaprine (FLEXERIL) 5 MG tablet Take 1 tablet (5 mg total) by mouth 3 (three) times daily as needed.   escitalopram (LEXAPRO) 5 MG tablet Take 10 mg by mouth daily.   fluticasone (FLONASE) 50 MCG/ACT nasal spray Place into both nostrils.   HYDROcodone-acetaminophen (NORCO/VICODIN) 5-325 MG tablet Take 1 tablet  by mouth every 6 (six) hours as needed for severe pain.   hydrOXYzine (ATARAX/VISTARIL) 10 MG tablet Take 20 mg by mouth at bedtime.   ibuprofen (ADVIL) 800 MG tablet Take 800 mg by mouth 2 (two) times daily as needed.   metaxalone (SKELAXIN) 800 MG tablet Take 1 tablet (800 mg total) by mouth 3 (three) times daily.   montelukast (SINGULAIR) 10 MG tablet Take 1 tablet by mouth daily.   nabumetone (RELAFEN) 500 MG tablet Take 1 tablet (500 mg total) by mouth 2 (two) times daily.   promethazine-dextromethorphan (PROMETHAZINE-DM) 6.25-15 MG/5ML syrup Take 5 mLs by mouth 4 (four) times daily as needed.   traMADol (ULTRAM) 50 MG tablet Take 50 mg by mouth 3 (three) times daily.   traZODone (DESYREL) 50 MG tablet Take 50 mg by mouth at bedtime.   TRI-ESTARYLLA 0.18/0.215/0.25 MG-35 MCG tablet Take 1 tablet by mouth daily.   [DISCONTINUED] levothyroxine (SYNTHROID) 100 MCG tablet TAKE 1 TABLET BY MOUTH DAILY BEFORE BREAKFAST.   levothyroxine (SYNTHROID) 100 MCG tablet Take 1 tablet (100 mcg total) by mouth daily before breakfast.   No facility-administered encounter medications on file as of 09/21/2022.    ALLERGIES: Allergies  Allergen Reactions   Augmentin [Amoxicillin-Pot Clavulanate] Nausea And Vomiting   Prednisone  Anxiety    VACCINATION STATUS:  There is no immunization history on file for this patient.   HPI Thyroid Problem Presents for follow-up visit. The condition has lasted for 3 months. Symptoms include weight gain. Patient reports no anxiety, cold intolerance, constipation, depressed mood, fatigue, leg swelling, palpitations or tremors. The symptoms have been stable. Past treatments include nothing. Risk factors include family history of hyperthyroidism and family history of hypothyroidism.     Paula Holden is 26 y.o. female who presents today with a medical history as above. she is being seen in follow up after being seen in consultation for hyperthyroidism requested by  Lenoria Chime, FNP.    she denies dysphagia, choking, shortness of breath, no recent voice change.    she does have family history of thyroid dysfunction in her cousin (Graves disease), maternal grandmother (Hypo), and great aunt (unknown), but denies family hx of thyroid cancer. she denies personal history of goiter. she is not on any anti-thyroid medications nor on any thyroid hormone supplements. She does endorse use of Biotin containing supplements.    She had RAI ablation for Graves disease on 04/01/21.  Review of systems  Constitutional: + steadily increasing body weight,  current Body mass index is 32.54 kg/m. , no fatigue, no subjective hyperthermia, no subjective hypothermia Eyes: no blurry vision, no xerophthalmia ENT: no sore throat, no nodules palpated in throat, no dysphagia/odynophagia, no hoarseness Cardiovascular: no chest pain, no shortness of breath, no palpitations, no leg swelling Respiratory: no cough, no shortness of breath Gastrointestinal: no nausea/vomiting/diarrhea Musculoskeletal: no muscle/joint aches Skin: no rashes, no hyperemia Neurological: no tremors, no numbness, no tingling, no dizziness Psychiatric: no depression, no anxiety   Objective:    BP 127/77 (BP Location: Left Arm, Patient Position: Sitting, Cuff Size: Large)   Pulse 76   Ht 5\' 8"  (1.727 m)   Wt 214 lb (97.1 kg)   BMI 32.54 kg/m   Wt Readings from Last 3 Encounters:  09/21/22 214 lb (97.1 kg)  05/25/22 204 lb (92.5 kg)  03/23/22 207 lb 6.4 oz (94.1 kg)     BP Readings from Last 3 Encounters:  09/21/22 127/77  08/22/22 119/85  05/25/22 130/80     Physical Exam- Limited  Constitutional:  Body mass index is 32.54 kg/m. , not in acute distress, normal state of mind Eyes:  EOMI, no exophthalmos Neck: Supple Cardiovascular: RRR, no murmurs, rubs, or gallops, no edema Respiratory: Adequate breathing efforts, no crackles, rales, rhonchi, or wheezing Musculoskeletal: no gross  deformities, strength intact in all four extremities, no gross restriction of joint movements Skin:  no rashes, no hyperemia Neurological: no tremor with outstretched hands   CMP     Component Value Date/Time   NA 139 03/21/2022 0838   K 4.0 03/21/2022 0838   CL 106 03/21/2022 0838   CO2 20 03/21/2022 0838   GLUCOSE 82 03/21/2022 0838   BUN 8 03/21/2022 0838   CREATININE 0.77 03/21/2022 0838   CALCIUM 8.5 (L) 03/21/2022 0838   PROT 6.2 03/21/2022 0838   ALBUMIN 4.0 03/21/2022 0838   AST 12 03/21/2022 0838   ALT 11 03/21/2022 0838   ALKPHOS 70 03/21/2022 0838   BILITOT 0.5 03/21/2022 0838     CBC No results found for: "WBC", "RBC", "HGB", "HCT", "PLT", "MCV", "MCH", "MCHC", "RDW", "LYMPHSABS", "MONOABS", "EOSABS", "BASOSABS"   Diabetic Labs (most recent): Lab Results  Component Value Date   HGBA1C 5.1 03/21/2022    Lipid Panel  No results found  for: "CHOL", "TRIG", "HDL", "CHOLHDL", "VLDL", "LDLCALC", "LDLDIRECT", "LABVLDL"   Lab Results  Component Value Date   TSH 1.65 08/24/2022   TSH 1.420 03/21/2022   TSH 4.240 12/22/2021   TSH 19.600 (H) 09/09/2021   TSH 42.800 (H) 07/15/2021   TSH 0.267 (L) 05/07/2021   TSH 2.595 04/01/2021   TSH 2.260 03/19/2021   TSH 0.15 (A) 03/04/2021   FREET4 1.13 03/21/2022   FREET4 1.19 12/22/2021   FREET4 0.88 09/09/2021   FREET4 0.37 (L) 07/15/2021   FREET4 1.05 05/07/2021   FREET4 0.66 04/01/2021   FREET4 0.99 03/19/2021      Uptake and scan 03/19/21  CLINICAL DATA:  Suppressed TSH suspicious for hyperthyroidism, neck swelling and tenderness, difficulty swallowing, heat intolerance, difficulty sleeping, hand tremors, irritability and mood venous, increased perspiration, heart palpitations, weakness, dry skin   EXAM: THYROID SCAN AND UPTAKE - 4 AND 24 HOURS   TECHNIQUE: Following oral administration of I-123 capsule, anterior planar imaging was acquired at 24 hours. Thyroid uptake was calculated with a thyroid probe  at 4-6 hours and 24 hours.   RADIOPHARMACEUTICALS:  304 uCi I-123 sodium iodide p.o.   COMPARISON:  None   FINDINGS: Homogeneous tracer distribution in both thyroid lobes.   No focal areas of increased or decreased tracer localization seen.   4 hour I-123 uptake = 23.5% (normal 5-20%)   24 hour I-123 uptake = 43.4% (normal 10-30%)   IMPRESSION: Normal thyroid scan.   Elevated 4 hour and 24 hour radio iodine uptakes consistent with hyperthyroidism.   Overall findings consistent with Graves disease.     Electronically Signed   By: Ulyses Southward M.D.   On: 03/19/2021 12:56   TSH TSH Resulted: 08/24/22 0000  Result status: Final  Resulting lab: [id number]  Reference range: 0.41 - 5.90  Value: 1.65  Comment: T4 10.6, T3 Uptake 19, T4,Free 2.0    Assessment & Plan:   1. Hypothyroidism s/p RAI ablation for Graves disease  she is being seen at a kind request of White, Raenette Rover, FNP.  She is status post RAI on 04/01/21.     Her previsit thyroid function tests are consistent with appropriate hormone replacement.  She is advised to continue Levothyroxine 100 mcg po daily before breakfast.    - The correct intake of thyroid hormone (Levothyroxine, Synthroid), is on empty stomach first thing in the morning, with water, separated by at least 30 minutes from breakfast and other medications,  and separated by more than 4 hours from calcium, iron, multivitamins, acid reflux medications (PPIs).  - This medication is a life-long medication and will be needed to correct thyroid hormone imbalances for the rest of your life.  The dose may change from time to time, based on thyroid blood work.  - It is extremely important to be consistent taking this medication, near the same time each morning.  -AVOID TAKING PRODUCTS CONTAINING BIOTIN (commonly found in Hair, Skin, Nails vitamins) AS IT INTERFERES WITH THE VALIDITY OF THYROID FUNCTION BLOOD TESTS.  2. Family history of Type 2  Diabetes  Her most recent A1c was 5.1%-normal.  She did have high glucose on recent labs but she was not fasting for those.  She does have extensive family history of diabetes. - Nutritional counseling repeated at each appointment due to patients tendency to fall back in to old habits.  -  Suggestion is made for the patient to avoid simple carbohydrates from their diet including Cakes, Sweet Desserts / Pastries, Ice  Cream, Soda (diet and regular), Sweet Tea, Candies, Chips, Cookies, Sweet Pastries, Store Bought Juices, Alcohol in Excess of 1-2 drinks a day, Artificial Sweeteners, Coffee Creamer, and "Sugar-free" Products. This will help patient to have stable blood glucose profile and potentially avoid unintended weight gain.   - I encouraged the patient to switch to unprocessed or minimally processed complex starch and increased protein intake (animal or plant source), fruits, and vegetables.   - Patient is advised to stick to a routine mealtimes to eat 3 meals a day and avoid unnecessary snacks (to snack only to correct hypoglycemia).     -Patient is advised to maintain close follow up with Lenoria Chime, FNP for primary care needs.     I spent 20 minutes in the care of the patient today including review of labs from Thyroid Function, CMP, and other relevant labs ; imaging/biopsy records (current and previous including abstractions from other facilities); face-to-face time discussing  her lab results and symptoms, medications doses, her options of short and long term treatment based on the latest standards of care / guidelines;   and documenting the encounter.  [facility]  participated in the discussions, expressed understanding, and voiced agreement with the above plans.  All questions were answered to her satisfaction. she is encouraged to contact clinic should she have any questions or concerns prior to her return visit.    Follow up plan: Return in about 4 months (around  01/22/2023) for Thyroid follow up, Previsit labs, Virtual visit ok.  Ronny Bacon, [facility] [address] [place], [place] [address] Phone: [phone] Fax: [phone]  09/21/2022, 8:37 AM

## 2023-01-05 ENCOUNTER — Ambulatory Visit
Admission: EM | Admit: 2023-01-05 | Discharge: 2023-01-05 | Disposition: A | Discharge status: Home / Self Care | Payer: No Typology Code available for payment source | Attending: Family Medicine | Admitting: Family Medicine

## 2023-01-05 DIAGNOSIS — G43909 Migraine, unspecified, not intractable, without status migrainosus: Secondary | ICD-10-CM | POA: Diagnosis not present

## 2023-01-05 DIAGNOSIS — R110 Nausea: Secondary | ICD-10-CM | POA: Diagnosis not present

## 2023-01-05 MED ORDER — KETOROLAC TROMETHAMINE 30 MG/ML IJ SOLN
30.0000 mg | Freq: Once | INTRAMUSCULAR | Status: AC
  Administered 2023-01-05: 30 mg via INTRAMUSCULAR

## 2023-01-05 MED ORDER — SUMATRIPTAN SUCCINATE 50 MG PO TABS
ORAL_TABLET | ORAL | 0 refills | Status: DC
Start: 2023-01-05 — End: 2023-12-29

## 2023-01-05 MED ORDER — ONDANSETRON 4 MG PO TBDP: 4.0000 mg | ORAL_TABLET | Freq: Three times a day (TID) | ORAL | 0 refills | Status: AC | PRN

## 2023-01-05 NOTE — ED Triage Notes (Signed)
Pt reports headache and nausea x 2 days. Tramadol and Excedrin gives no relief.

## 2023-01-05 NOTE — ED Provider Notes (Signed)
[facility]    CSN: [id number] Arrival date & time: 01/05/23  1821      History   Chief Complaint Chief Complaint  Patient presents with   Headache   Nausea    HPI Paula Holden is a 27 y.o. female.   Patient presenting today with 2-day history of migraine type headache, nausea.  Denies loss of vision, dizziness, chest pain, shortness of breath, vomiting, photophobia, phonophobia.  History of migraines, states she is been trying her tramadol and Excedrin which usually helps with no relief.    Past Medical History:  Diagnosis Date   Graves disease     Patient Active Problem List   Diagnosis Date Noted   Anxiety 05/25/2022   Depression 05/25/2022   PTSD (post-traumatic stress disorder) 05/25/2022   Graves disease 05/25/2022   Major depressive disorder, recurrent, severe without psychotic features ([place]) 05/25/2022    Past Surgical History:  Procedure Laterality Date   ADENOIDECTOMY     TONSILLECTOMY      OB History   No obstetric history on file.      Home Medications    Prior to Admission medications   Medication Sig Start Date End Date Taking? Authorizing Provider  ondansetron (ZOFRAN-ODT) 4 MG disintegrating tablet Take 1 tablet (4 mg total) by mouth every 8 (eight) hours as needed for nausea or vomiting. 01/05/23  Yes Volney American, PA-C  SUMAtriptan (IMITREX) 50 MG tablet Take 1 tab at onset of migraine. May repeat in 2 hours if headache persists or recurs. Max of 2 tabs daily 01/05/23  Yes Volney American, PA-C  brompheniramine-pseudoephedrine-DM 30-2-10 MG/5ML syrup Take 5 mLs by mouth 4 (four) times daily as needed. 08/22/22   Leath-Warren, Alda Lea, NP  buPROPion (WELLBUTRIN SR) 200 MG 12 hr tablet Take 200 mg by mouth 2 (two) times daily. 09/10/21   [provider]  busPIRone (BUSPAR) 10 MG tablet Take 10 mg by mouth 2 (two) times daily. 12/01/20   [provider]  cyclobenzaprine (FLEXERIL) 5 MG tablet  Take 1 tablet (5 mg total) by mouth 3 (three) times daily as needed. 09/05/22   Carole Civil, MD  escitalopram (LEXAPRO) 5 MG tablet Take 10 mg by mouth daily. 04/21/22   [provider]  fluticasone (FLONASE) 50 MCG/ACT nasal spray Place into both nostrils. 11/12/20   [provider]  HYDROcodone-acetaminophen (NORCO/VICODIN) 5-325 MG tablet Take 1 tablet by mouth every 6 (six) hours as needed for severe pain. 05/12/22   Roemhildt, Lorin T, PA-C  hydrOXYzine (ATARAX/VISTARIL) 10 MG tablet Take 20 mg by mouth at bedtime. 01/30/21   [provider]  ibuprofen (ADVIL) 800 MG tablet Take 800 mg by mouth 2 (two) times daily as needed. 10/12/20   [provider]  levothyroxine (SYNTHROID) 100 MCG tablet Take 1 tablet (100 mcg total) by mouth daily before breakfast. 09/21/22   Brita Romp, NP  metaxalone (SKELAXIN) 800 MG tablet Take 1 tablet (800 mg total) by mouth 3 (three) times daily. 09/05/22   Carole Civil, MD  montelukast (SINGULAIR) 10 MG tablet Take 1 tablet by mouth daily. 03/18/21   [provider]  nabumetone (RELAFEN) 500 MG tablet Take 1 tablet (500 mg total) by mouth 2 (two) times daily. 09/05/22   Carole Civil, MD  promethazine-dextromethorphan (PROMETHAZINE-DM) 6.25-15 MG/5ML syrup Take 5 mLs by mouth 4 (four) times daily as needed. 01/14/22   Volney American, PA-C  traMADol (ULTRAM) 50 MG tablet Take 50  mg by mouth 3 (three) times daily. 04/07/21   [provider]  traZODone (DESYREL) 50 MG tablet Take 50 mg by mouth at bedtime. 07/15/21   [provider]  TRI-ESTARYLLA 0.18/0.215/0.25 MG-35 MCG tablet Take 1 tablet by mouth daily. 07/23/21   [provider]    Family History Family History  Problem Relation Age of Onset   Hyperlipidemia Mother    Thyroid disease Maternal Grandmother     Social History Social History   Tobacco Use   Smoking status: Never   Smokeless tobacco: Never   Vaping Use   Vaping Use: Never used  Substance Use Topics   Alcohol use: Yes    Comment: Social   Drug use: Never     Allergies   Augmentin [amoxicillin-pot clavulanate] and Prednisone   Review of Systems Review of Systems PER HPI  Physical Exam Triage Vital Signs ED Triage Vitals  Enc Vitals Group     BP 01/05/23 1827 117/81     Pulse Rate 01/05/23 1827 98     Resp 01/05/23 1827 16     Temp 01/05/23 1827 98.1 F (36.7 C)     Temp Source 01/05/23 1827 Oral     SpO2 01/05/23 1827 96 %     Weight --      Height --      Head Circumference --      Peak Flow --      Pain Score 01/05/23 1829 7     Pain Loc --      Pain Edu? --      Excl. in [place]? --    No data found.  Updated Vital Signs BP 117/81 (BP Location: Right Arm)   Pulse 98   Temp 98.1 F (36.7 C) (Oral)   Resp 16   SpO2 96%   Visual Acuity Right Eye Distance:   Left Eye Distance:   Bilateral Distance:    Right Eye Near:   Left Eye Near:    Bilateral Near:     Physical Exam Vitals and nursing note reviewed.  Constitutional:      Appearance: Normal appearance. She is not ill-appearing.  HENT:     Head: Atraumatic.     Mouth/Throat:     Mouth: Mucous membranes are moist.  Eyes:     Extraocular Movements: Extraocular movements intact.     Conjunctiva/sclera: Conjunctivae normal.     Pupils: Pupils are equal, round, and reactive to light.  Cardiovascular:     Rate and Rhythm: Normal rate and regular rhythm.     Heart sounds: Normal heart sounds.  Pulmonary:     Effort: Pulmonary effort is normal.     Breath sounds: Normal breath sounds.  Abdominal:     General: Bowel sounds are normal. There is no distension.     Palpations: Abdomen is soft.     Tenderness: There is no abdominal tenderness. There is no guarding.  Musculoskeletal:        General: Normal range of motion.     Cervical back: Normal range of motion and neck supple.  Skin:    General: Skin is warm and dry.  Neurological:      General: No focal deficit present.     Mental Status: She is alert and oriented to person, place, and time.     Cranial Nerves: No cranial nerve deficit.     Motor: No weakness.     Gait: Gait normal.  Psychiatric:  Mood and Affect: Mood normal.        Thought Content: Thought content normal.        Judgment: Judgment normal.      UC Treatments / Results  Labs (all labs ordered are listed, but only abnormal results are displayed) Labs Reviewed - No data to display  EKG   Radiology No results found.  Procedures Procedures (including critical care time)  Medications Ordered in UC Medications  ketorolac (TORADOL) 30 MG/ML injection 30 mg (30 mg Intramuscular Given 01/05/23 1857)    Initial Impression / Assessment and Plan / UC Course  I have reviewed the triage vital signs and the nursing notes.  Pertinent labs & imaging results that were available during my care of the patient were reviewed by me and considered in my medical decision making (see chart for details).     Vitals and exam are very reassuring today with no focal neurologic deficits.  No red flag findings today.  Consistent with migraine headache.  Treat with IM Toradol, Zofran, Imitrex as needed.  Return for worsening symptoms.  Final Clinical Impressions(s) / UC Diagnoses   Final diagnoses:  Migraine without status migrainosus, not intractable, unspecified migraine type  Nausea   Discharge Instructions   None    ED Prescriptions     Medication Sig Dispense Auth. Provider   ondansetron (ZOFRAN-ODT) 4 MG disintegrating tablet Take 1 tablet (4 mg total) by mouth every 8 (eight) hours as needed for nausea or vomiting. 20 tablet Particia Nearing, [place]   SUMAtriptan (IMITREX) 50 MG tablet Take 1 tab at onset of migraine. May repeat in 2 hours if headache persists or recurs. Max of 2 tabs daily 10 tablet Particia Nearing, [place]      PDMP not reviewed this encounter.   Particia Nearing, [place] 01/05/23 1945

## 2023-01-20 NOTE — Patient Instructions (Incomplete)

## 2023-01-23 ENCOUNTER — Ambulatory Visit: Payer: No Typology Code available for payment source | Admitting: Nurse Practitioner

## 2023-02-07 ENCOUNTER — Ambulatory Visit
Admission: EM | Admit: 2023-02-07 | Discharge: 2023-02-07 | Disposition: A | Discharge status: Home / Self Care | Payer: No Typology Code available for payment source | Attending: Nurse Practitioner | Admitting: Nurse Practitioner

## 2023-02-07 DIAGNOSIS — J329 Chronic sinusitis, unspecified: Secondary | ICD-10-CM

## 2023-02-07 DIAGNOSIS — B9689 Other specified bacterial agents as the cause of diseases classified elsewhere: Secondary | ICD-10-CM | POA: Diagnosis not present

## 2023-02-07 MED ORDER — DOXYCYCLINE HYCLATE 100 MG PO CAPS: 100.0000 mg | ORAL_CAPSULE | Freq: Two times a day (BID) | ORAL | 0 refills | Status: AC

## 2023-02-07 MED ORDER — PROMETHAZINE-DM 6.25-15 MG/5ML PO SYRP: 5.0000 mL | ORAL_SOLUTION | Freq: Every evening | ORAL | 0 refills | Status: AC | PRN

## 2023-02-07 NOTE — Discharge Instructions (Addendum)
As we discussed, it sounds like you have a sinus infection.  Take doxycycline as prescribed to treat it.    Some things that can make you feel better are: - Increased rest - Increasing fluid with water/sugar free electrolytes - Acetaminophen and ibuprofen as needed for fever/pain - Salt water gargling, chloraseptic spray and throat lozenges for sore throat - OTC guaifenesin (Mucinex) 600 mg twice daily for congestion - Saline sinus flushes or a neti pot for congestion - Humidifying the air - Cough syrup at nighttime as needed for dry cough

## 2023-02-07 NOTE — ED Triage Notes (Signed)
Pt reports bilateral ear pain, cough and congestion x 5 days. Dayquil and Nyquil gives no relief

## 2023-02-07 NOTE — ED Provider Notes (Signed)
[facility]    CSN: [id number] Arrival date & time: 02/07/23  1748      History   Chief Complaint Chief Complaint  Patient presents with   Otalgia    HPI Paula Holden is a 27 y.o. female.   Patient presents today with 5 day history of cough.  Also having fever, body aches, chills, chest congestion, nasal congestion, runny nose, post nasal drainage, sinus pressure, headache, ear pain, decreased appetite, rash, and fatigue.  Patient denies sore throat, shortness of breath, chest pain with coughing, wheezing, chest tightness, abdominal pain, nausea, vomiting, diarrhea, loss of taste, and loss of smell. No known sick contacts.  Has taken nyquil, Dayquil, Bromphed without improvement.   Patient reports her last menstrual period was in January and she long cycled her OCPs so she did not have a period in February.  Reports she is confident she is not pregnant.    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      Home Medications    Prior to Admission medications   Medication Sig Start Date End Date Taking? Authorizing Provider  doxycycline (VIBRAMYCIN) 100 MG capsule Take 1 capsule (100 mg total) by mouth 2 (two) times daily for 7 days. 02/07/23 02/14/23 Yes Eulogio Bear, NP  promethazine-dextromethorphan (PROMETHAZINE-DM) 6.25-15 MG/5ML syrup Take 5 mLs by mouth at bedtime as needed for cough. 02/07/23  Yes Eulogio Bear, NP  buPROPion (WELLBUTRIN SR) 200 MG 12 hr tablet Take 200 mg by mouth 2 (two) times daily. 09/10/21   [provider]  cyclobenzaprine (FLEXERIL) 5 MG tablet Take 1 tablet (5 mg total) by mouth 3 (three) times daily as needed. 09/05/22   Carole Civil, MD  escitalopram (LEXAPRO) 5 MG tablet Take 10 mg by mouth daily. 04/21/22   [provider]  fluticasone (FLONASE) 50 MCG/ACT nasal spray Place into both nostrils. 11/12/20   [provider]  hydrOXYzine (ATARAX/VISTARIL) 10 MG tablet Take 20 mg by mouth at bedtime. 01/30/21   [provider]  levothyroxine (SYNTHROID) 100 MCG tablet Take 1 tablet (100 mcg total) by mouth daily before breakfast. 09/21/22   Brita Romp, NP  metaxalone (SKELAXIN) 800 MG tablet Take 1 tablet (800 mg total) by mouth 3 (three) times daily. 09/05/22   Carole Civil, MD  montelukast (SINGULAIR) 10 MG tablet Take 1 tablet by mouth daily. 03/18/21   [provider]  nabumetone (RELAFEN) 500 MG tablet Take 1 tablet (500 mg total) by mouth 2 (two) times daily. 09/05/22   Carole Civil, MD  ondansetron (ZOFRAN-ODT) 4 MG disintegrating tablet Take 1 tablet (4 mg total) by mouth every 8 (eight) hours as needed for nausea or vomiting. 01/05/23   Volney American, PA-C  SUMAtriptan (IMITREX) 50 MG tablet Take 1 tab at onset of migraine. May repeat in 2 hours if headache persists or recurs. Max of 2 tabs daily 01/05/23   Volney American, PA-C  traMADol (ULTRAM) 50 MG tablet Take 50 mg by mouth 3 (  three) times daily. 04/07/21   [provider]  traZODone (DESYREL) 50 MG tablet Take 50 mg by mouth at bedtime. 07/15/21   [provider]  TRI-ESTARYLLA 0.18/0.215/0.25 MG-35 MCG tablet Take 1 tablet by mouth daily. 07/23/21   [provider]    Family History Family History  Problem Relation Age of Onset   Hyperlipidemia Mother    Thyroid disease Maternal Grandmother     Social History Social History   Tobacco Use   Smoking status: Never   Smokeless tobacco: Never  Vaping Use   Vaping Use:  Never used  Substance Use Topics   Alcohol use: Yes    Comment: Social   Drug use: Never     Allergies   Augmentin [amoxicillin-pot clavulanate] and Prednisone   Review of Systems Review of Systems Per HPI  Physical Exam Triage Vital Signs ED Triage Vitals  Enc Vitals Group     BP 02/07/23 1818 124/85     Pulse Rate 02/07/23 1818 86     Resp 02/07/23 1818 18     Temp 02/07/23 1818 98.1 F (36.7 C)     Temp Source 02/07/23 1818 Oral     SpO2 02/07/23 1818 98 %     Weight --      Height --      Head Circumference --      Peak Flow --      Pain Score 02/07/23 1819 6     Pain Loc --      Pain Edu? --      Excl. in [place]? --    No data found.  Updated Vital Signs BP 124/85 (BP Location: Right Arm)   Pulse 86   Temp 98.1 F (36.7 C) (Oral)   Resp 18   SpO2 98%   Visual Acuity Right Eye Distance:   Left Eye Distance:   Bilateral Distance:    Right Eye Near:   Left Eye Near:    Bilateral Near:     Physical Exam Vitals and nursing note reviewed.  Constitutional:      General: She is not in acute distress.    Appearance: Normal appearance. She is not ill-appearing or toxic-appearing.  HENT:     Head: Normocephalic and atraumatic.     Right Ear: Tympanic membrane, ear canal and external ear normal.     Left Ear: Tympanic membrane, ear canal and external ear normal.     Nose: Congestion and rhinorrhea present.     Right Sinus: Maxillary sinus tenderness and frontal sinus tenderness present.     Left Sinus: Maxillary sinus tenderness and frontal sinus tenderness present.     Mouth/Throat:     Mouth: Mucous membranes are moist.     Pharynx: Oropharynx is clear. Posterior oropharyngeal erythema present. No oropharyngeal exudate.  Eyes:     General: No scleral icterus.    Extraocular Movements: Extraocular movements intact.  Cardiovascular:     Rate and Rhythm: Normal rate and regular rhythm.  Pulmonary:     Effort: Pulmonary effort is normal. No respiratory  distress.     Breath sounds: Normal breath sounds. No wheezing, rhonchi or rales.  Abdominal:     General: Abdomen is flat. Bowel sounds are normal. There is no distension.     Palpations: Abdomen is soft.     Tenderness: There is no abdominal tenderness.  Musculoskeletal:     Cervical back: Normal range of motion and neck supple.  Lymphadenopathy:  Cervical: No cervical adenopathy.  Skin:    General: Skin is warm and dry.     Coloration: Skin is not jaundiced or pale.     Findings: No erythema or rash.  Neurological:     Mental Status: She is alert and oriented to person, place, and time.  Psychiatric:        Behavior: Behavior is cooperative.      UC Treatments / Results  Labs (all labs ordered are listed, but only abnormal results are displayed) Labs Reviewed - No data to display  EKG   Radiology No results found.  Procedures Procedures (including critical care time)  Medications Ordered in UC Medications - No data to display  Initial Impression / Assessment and Plan / UC Course  I have reviewed the triage vital signs and the nursing notes.  Pertinent labs & imaging results that were available during my care of the patient were reviewed by me and considered in my medical decision making (see chart for details).   Patient is well-appearing, normotensive, afebrile, not tachycardic, not tachypneic, oxygenating well on room air.    1. Bacterial sinusitis Treat with doxycycline twice daily for 7 days Other supportive care discussed including Mucinex, promethazine-DM Discontinue use of Bromfed if using promethazine-DM ER and return precautions discussed with patient Note given for work  The patient was given the opportunity to ask questions.  All questions answered to their satisfaction.  The patient is in agreement to this plan.    Final Clinical Impressions(s) / UC Diagnoses   Final diagnoses:  Bacterial sinusitis     Discharge Instructions      As we  discussed, it sounds like you have a sinus infection.  Take doxycycline as prescribed to treat it.    Some things that can make you feel better are: - Increased rest - Increasing fluid with water/sugar free electrolytes - Acetaminophen and ibuprofen as needed for fever/pain - Salt water gargling, chloraseptic spray and throat lozenges for sore throat - OTC guaifenesin (Mucinex) 600 mg twice daily for congestion - Saline sinus flushes or a neti pot for congestion - Humidifying the air - Cough syrup at nighttime as needed for dry cough     ED Prescriptions     Medication Sig Dispense Auth. Provider   doxycycline (VIBRAMYCIN) 100 MG capsule Take 1 capsule (100 mg total) by mouth 2 (two) times daily for 7 days. 14 capsule Noemi Chapel A, NP   promethazine-dextromethorphan (PROMETHAZINE-DM) 6.25-15 MG/5ML syrup Take 5 mLs by mouth at bedtime as needed for cough. 118 mL Eulogio Bear, NP      PDMP not reviewed this encounter.   Eulogio Bear, NP 02/07/23 1904

## 2023-02-09 ENCOUNTER — Encounter: Payer: Self-pay | Admitting: Radiology

## 2023-02-10 ENCOUNTER — Telehealth: Payer: Self-pay | Admitting: Emergency Medicine

## 2023-02-10 NOTE — Telephone Encounter (Signed)
Pt called and reported nausea/emesis with taking doxycycline and inquired if amoxicillin could be prescribed.   Consulted provider and reported pt to continue current prescription as this was the most appropriate medication to treat bacterial sinusitis.  Pt reported also has previous prescription for zofran and inquired if this would help with emesis at time of taking abx. Pt aware can take zofran with abx. Discussed if symptoms not improved at the completion of abx course pt can return to [facility] for re-evaluation.

## 2023-02-22 ENCOUNTER — Ambulatory Visit: Payer: No Typology Code available for payment source | Admitting: Nurse Practitioner

## 2023-02-27 IMAGING — NM NM RAI THERAPY FOR HYPERTHYROIDISM
1 series · 1 of 1 positions shown · non-contrast
Comparison: Thyroid uptake and scan 03/19/2021

CLINICAL DATA: Hyperthyroidism

EXAM:
RADIOACTIVE IODINE THERAPY FOR HYPERTHYROIDISM
TECHNIQUE: Radioactive iodine prescribed by myself. The risks and benefits of
radioactive iodine therapy were discussed with the patient in detail
by myself. Alternative therapies were also mentioned. Radiation
safety was discussed with the patient, including how to protect the
general public from exposure. There were no barriers to
communication. Written consent was obtained. Time-out protocol
followed. The patient then received a capsule containing the
radiopharmaceutical.
The patient will follow-up with the referring physician.
RADIOPHARMACEUTICALS:  18.3 mCi 3-373 sodium iodide orally

[Series 1: bone statics · 2.07mm/px · 1 of 1 slices shown]
[im 1/1]
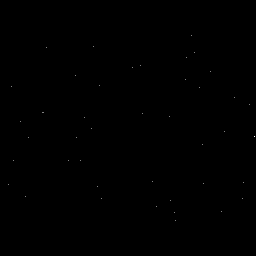

[1 of 1 positions shown; findings below may reference images not displayed]

IMPRESSION: Per oral administration of 3-373 sodium iodide for the treatment of
hyperthyroidism.

## 2023-04-08 ENCOUNTER — Emergency Department (HOSPITAL_COMMUNITY): Payer: No Typology Code available for payment source

## 2023-04-08 ENCOUNTER — Encounter (HOSPITAL_COMMUNITY): Payer: Self-pay | Admitting: Emergency Medicine

## 2023-04-08 ENCOUNTER — Emergency Department (HOSPITAL_COMMUNITY)
Admission: EM | Admit: 2023-04-08 | Discharge: 2023-04-08 | Disposition: A | Discharge status: Home / Self Care | Payer: No Typology Code available for payment source | Attending: Emergency Medicine | Admitting: Emergency Medicine

## 2023-04-08 ENCOUNTER — Other Ambulatory Visit: Payer: Self-pay

## 2023-04-08 DIAGNOSIS — M25532 Pain in left wrist: Secondary | ICD-10-CM | POA: Insufficient documentation

## 2023-04-08 DIAGNOSIS — M79642 Pain in left hand: Secondary | ICD-10-CM | POA: Diagnosis not present

## 2023-04-08 MED ORDER — IBUPROFEN 800 MG PO TABS
800.0000 mg | ORAL_TABLET | Freq: Once | ORAL | Status: AC
Start: 2023-04-08 — End: 2023-04-08
  Administered 2023-04-08: 800 mg via ORAL
  Filled 2023-04-08: qty 1

## 2023-04-08 MED ORDER — NAPROXEN 500 MG PO TABS: 500.0000 mg | ORAL_TABLET | Freq: Two times a day (BID) | ORAL | 1 refills | Status: AC

## 2023-04-08 NOTE — ED Provider Notes (Signed)
[place] EMERGENCY DEPARTMENT AT [facility] Provider Note   CSN: [id number] Arrival date & time: 04/08/23  1910     History {Add pertinent medical, surgical, social history, OB history to HPI:1} Chief Complaint  Patient presents with   Hand Pain    Paula Holden is a 27 y.o. female.  Patient has a history of Graves' disease.  She is complains of left wrist and hand pain.   Hand Pain       Home Medications Prior to Admission medications   Medication Sig Start Date End Date Taking? Authorizing Provider  buPROPion (WELLBUTRIN SR) 200 MG 12 hr tablet Take 200 mg by mouth daily. 09/10/21  Yes [provider]  cyclobenzaprine (FLEXERIL) 5 MG tablet Take 1 tablet (5 mg total) by mouth 3 (three) times daily as needed. Patient taking differently: Take 5 mg by mouth 3 (three) times daily as needed for muscle spasms. 09/05/22  Yes Vickki Hearing, MD  fluticasone (FLONASE) 50 MCG/ACT nasal spray Place 2 sprays into both nostrils daily. 11/12/20  Yes [provider]  hydrOXYzine (ATARAX) 25 MG tablet Take 25 mg by mouth 3 (three) times daily as needed for anxiety. 01/30/21  Yes [provider]  naproxen (NAPROSYN) 500 MG tablet Take 1 tablet (500 mg total) by mouth 2 (two) times daily. 04/08/23  Yes Bethann Berkshire, MD  levothyroxine (SYNTHROID) 100 MCG tablet Take 1 tablet (100 mcg total) by mouth daily before breakfast. 09/21/22   Dani Gobble, NP  metaxalone (SKELAXIN) 800 MG tablet Take 1 tablet (800 mg total) by mouth 3 (three) times daily. 09/05/22   Vickki Hearing, MD  montelukast (SINGULAIR) 10 MG tablet Take 1 tablet by mouth daily. 03/18/21   [provider]  nabumetone (RELAFEN) 500 MG tablet Take 1 tablet (500 mg total) by mouth 2 (two) times daily. 09/05/22   Vickki Hearing, MD  ondansetron (ZOFRAN-ODT) 4 MG disintegrating tablet Take 1 tablet (4 mg total) by mouth every 8 (eight) hours as needed for nausea or  vomiting. 01/05/23   Particia Nearing, PA-C  promethazine-dextromethorphan (PROMETHAZINE-DM) 6.25-15 MG/5ML syrup Take 5 mLs by mouth at bedtime as needed for cough. 02/07/23   Valentino Nose, NP  SUMAtriptan (IMITREX) 50 MG tablet Take 1 tab at onset of migraine. May repeat in 2 hours if headache persists or recurs. Max of 2 tabs daily 01/05/23   Particia Nearing, PA-C  traMADol (ULTRAM) 50 MG tablet Take 50 mg by mouth 3 (three) times daily. 04/07/21   [provider]  traZODone (DESYREL) 50 MG tablet Take 50 mg by mouth at bedtime. 07/15/21   [provider]  TRI-ESTARYLLA 0.18/0.215/0.25 MG-35 MCG tablet Take 1 tablet by mouth daily. 07/23/21   [provider]      Allergies    Augmentin [amoxicillin-pot clavulanate] and Prednisone    Review of Systems   Review of Systems  Physical Exam Updated Vital Signs BP (!) 139/91 (BP Location: Right Arm)   Pulse 75   Temp 97.9 F (36.6 C) (Oral)   Resp 16   Ht 5\' 8"  (1.727 m)   Wt 90.7 kg   LMP 03/10/2023 (Approximate)   SpO2 100%   BMI 30.41 kg/m  Physical Exam  ED Results / Procedures / Treatments   Labs (all labs ordered are listed, but only abnormal results are displayed) Labs Reviewed - No data to display  EKG None  Radiology DG Wrist Complete Left  Result Date:  04/08/2023 CLINICAL DATA:  Wrist pain EXAM: LEFT WRIST - COMPLETE 3+ VIEW COMPARISON:  None Available. FINDINGS: No fracture or malalignment. Ulnar minus variance. 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 IMPRESSION: 1. No acute osseous abnormality. 2. 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 Electronically Signed   By: Jasmine Pang M.D.   On: 04/08/2023 20:15    Procedures Procedures  {Document cardiac monitor, telemetry assessment procedure when appropriate:1}  Medications Ordered in ED Medications  ibuprofen (ADVIL) tablet 800 mg (has no administration in time range)    ED Course/  Medical Decision Making/ A&P   {   Click here for ABCD2, HEART and other calculatorsREFRESH Note before signing :1}                          Medical Decision Making Amount and/or Complexity of Data Reviewed Radiology: ordered.  Risk Prescription drug management.   Inflamed left wrist most likely related to repetitive motion.  She is given Naprosyn and will follow-up with Ortho  {Document critical care time when appropriate:1} {Document review of labs and clinical decision tools ie heart score, Chads2Vasc2 etc:1}  {Document your independent review of radiology images, and any outside records:1} {Document your discussion with family members, caretakers, and with consultants:1} {Document social determinants of health affecting pt's care:1} {Document your decision making why or why not admission, treatments were needed:1} Final Clinical Impression(s) / ED Diagnoses Final diagnoses:  Left hand pain    Rx / DC Orders ED Discharge Orders          Ordered    naproxen (NAPROSYN) 500 MG tablet  2 times daily        04/08/23 2140

## 2023-04-08 NOTE — ED Triage Notes (Signed)
Pt c/o ongoing left hand pain that has been ongoing since December. Pt states usually it gets better on its on after a few days but it hasn't this time.

## 2023-04-08 NOTE — Discharge Instructions (Signed)
Follow-up with Dr. Romeo Apple in 1 to 2 weeks for recheck

## 2023-04-17 ENCOUNTER — Ambulatory Visit: Payer: No Typology Code available for payment source | Admitting: Orthopedic Surgery

## 2023-05-09 ENCOUNTER — Other Ambulatory Visit: Payer: Self-pay | Admitting: Nurse Practitioner

## 2023-05-09 ENCOUNTER — Telehealth: Payer: Self-pay | Admitting: Nurse Practitioner

## 2023-05-09 ENCOUNTER — Other Ambulatory Visit: Payer: Self-pay | Admitting: *Deleted

## 2023-05-09 DIAGNOSIS — E890 Postprocedural hypothyroidism: Secondary | ICD-10-CM

## 2023-05-09 DIAGNOSIS — E0500 Thyrotoxicosis with diffuse goiter without thyrotoxic crisis or storm: Secondary | ICD-10-CM

## 2023-05-09 NOTE — Telephone Encounter (Signed)
Can someone update these labs?   

## 2023-05-09 NOTE — Telephone Encounter (Signed)
Labs have been updated . 

## 2023-05-18 ENCOUNTER — Ambulatory Visit: Payer: No Typology Code available for payment source | Admitting: Nurse Practitioner

## 2023-07-24 ENCOUNTER — Ambulatory Visit
Admission: EM | Admit: 2023-07-24 | Discharge: 2023-07-24 | Disposition: A | Discharge status: Home / Self Care | Payer: No Typology Code available for payment source | Attending: Nurse Practitioner | Admitting: Nurse Practitioner

## 2023-07-24 DIAGNOSIS — H9203 Otalgia, bilateral: Secondary | ICD-10-CM | POA: Diagnosis present

## 2023-07-24 DIAGNOSIS — Z20822 Contact with and (suspected) exposure to covid-19: Secondary | ICD-10-CM | POA: Diagnosis not present

## 2023-07-24 DIAGNOSIS — Z1152 Encounter for screening for COVID-19: Secondary | ICD-10-CM | POA: Diagnosis not present

## 2023-07-24 DIAGNOSIS — J069 Acute upper respiratory infection, unspecified: Secondary | ICD-10-CM | POA: Insufficient documentation

## 2023-07-24 LAB — POCT RAPID STREP A (OFFICE): Rapid Strep A Screen: NEGATIVE

## 2023-07-24 MED ORDER — IPRATROPIUM BROMIDE 0.03 % NA SOLN: 2.0000 | Freq: Two times a day (BID) | NASAL | 12 refills | Status: AC

## 2023-07-24 MED ORDER — PSEUDOEPH-BROMPHEN-DM 30-2-10 MG/5ML PO SYRP: 5.0000 mL | ORAL_SOLUTION | Freq: Four times a day (QID) | ORAL | 0 refills | Status: DC | PRN

## 2023-07-24 NOTE — Discharge Instructions (Addendum)
The rapid strep test was negative, COVID test throat culture are pending.  You will be contacted if the pending test results are abnormal. Take medication as prescribed. Increase fluids and allow for plenty of rest. Recommend Tylenol or ibuprofen as needed for pain, fever, or general discomfort. Recommend throat lozenges, Chloraseptic or honey to help with throat pain. Warm salt water gargles 3-4 times daily to help with throat pain or discomfort. Recommend a diet with soft foods to include soups, broths, puddings, yogurt, Jell-O's, or popsicles until symptoms improve. Follow-up with your primary care physician or in this clinic if symptoms not improve over the next 7 to 10 days. Follow-up as needed.

## 2023-07-24 NOTE — ED Provider Notes (Signed)
[facility]    CSN: [id number] Arrival date & time: 07/24/23  1130      History   Chief Complaint No chief complaint on file.   HPI Paula Holden is a 27 y.o. female.   The history is provided by the patient.   Patient presents with a 1 day history of bilateral ear pain, body aches, nasal congestion, sore throat, and cough.  Patient states this morning, she had a fever of 101.  She denies ear drainage, wheezing, difficulty breathing, chest pain, nausea, vomiting, or diarrhea.  Patient reports that she has had a close exposure to someone with COVID.  She has not taken any medication for her symptoms. 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      Home Medications    Prior to Admission medications   Medication Sig Start Date End Date Taking? Authorizing Provider  brompheniramine-pseudoephedrine-DM 30-2-10 MG/5ML syrup Take 5 mLs by mouth 4 (four) times daily as needed. 07/24/23  Yes Ranier Coach-Warren, Sadie Haber, NP  clonazePAM (KLONOPIN) 0.5 MG disintegrating tablet Take 0.5 mg by mouth as needed. 05/01/23  Yes [provider]  gabapentin (NEURONTIN) 100 MG capsule Take 100 mg by mouth 3 (three) times daily as needed. 07/10/23  Yes [provider]  ipratropium (ATROVENT) 0.03 % nasal spray Place 2 sprays into both nostrils every 12 (twelve) hours. 07/24/23  Yes Tejay Hubert-Warren, Sadie Haber, NP  buPROPion (WELLBUTRIN SR) 200 MG 12 hr tablet Take 200 mg by mouth daily. 09/10/21   [provider]  cyclobenzaprine (FLEXERIL) 5 MG  tablet Take 1 tablet (5 mg total) by mouth 3 (three) times daily as needed. Patient taking differently: Take 5 mg by mouth 3 (three) times daily as needed for muscle spasms. 09/05/22   Vickki Hearing, MD  fluticasone (FLONASE) 50 MCG/ACT nasal spray Place 2 sprays into both nostrils daily. 11/12/20   [provider]  hydrOXYzine (ATARAX) 25 MG tablet Take 25 mg by mouth 3 (three) times daily as needed for anxiety. 01/30/21   [provider]  levothyroxine (SYNTHROID) 100 MCG tablet TAKE 1 TABLET BY MOUTH DAILY BEFORE BREAKFAST. 05/09/23   Dani Gobble, NP  metaxalone (SKELAXIN) 800 MG tablet Take 1 tablet (800 mg total) by mouth 3 (three) times daily. 09/05/22   Vickki Hearing, MD  montelukast (SINGULAIR) 10 MG tablet Take 1 tablet by mouth daily. 03/18/21   [provider]  nabumetone (RELAFEN) 500 MG tablet Take 1 tablet (500 mg total) by mouth 2 (two) times daily. 09/05/22   Vickki Hearing, MD  naproxen (NAPROSYN) 500 MG tablet Take 1 tablet (500 mg total) by mouth 2 (two) times daily. 04/08/23   Bethann Berkshire, MD  ondansetron (ZOFRAN-ODT) 4 MG disintegrating tablet Take 1 tablet (4 mg total) by mouth every 8 (eight) hours as needed for nausea or vomiting. 01/05/23   Particia Nearing, PA-C  promethazine-dextromethorphan (PROMETHAZINE-DM) 6.25-15 MG/5ML syrup Take 5 mLs by mouth at bedtime as needed for cough. 02/07/23   Valentino Nose,  NP  SUMAtriptan (IMITREX) 50 MG tablet Take 1 tab at onset of migraine. May repeat in 2 hours if headache persists or recurs. Max of 2 tabs daily 01/05/23   Particia Nearing, PA-C  traMADol (ULTRAM) 50 MG tablet Take 50 mg by mouth 3 (three) times daily. 04/07/21   [provider]  traZODone (DESYREL) 50 MG tablet Take 50 mg by mouth at bedtime. 07/15/21   [provider]  TRI-ESTARYLLA 0.18/0.215/0.25 MG-35 MCG tablet Take 1 tablet by mouth daily. 07/23/21   [provider]    Family  History Family History  Problem Relation Age of Onset   Hyperlipidemia Mother    Thyroid disease Maternal Grandmother     Social History Social History   Tobacco Use   Smoking status: Never   Smokeless tobacco: Never  Vaping Use   Vaping status: Never Used  Substance Use Topics   Alcohol use: Yes    Comment: Social   Drug use: Never     Allergies   Augmentin [amoxicillin-pot clavulanate] and Prednisone   Review of Systems Review of Systems Per HPI  Physical Exam Triage Vital Signs ED Triage Vitals  Encounter Vitals Group     BP 07/24/23 1233 119/84     Systolic BP Percentile --      Diastolic BP Percentile --      Pulse Rate 07/24/23 1233 94     Resp 07/24/23 1233 16     Temp 07/24/23 1233 99 F (37.2 C)     Temp Source 07/24/23 1233 Oral     SpO2 07/24/23 1233 100 %     Weight --      Height --      Head Circumference --      Peak Flow --      Pain Score 07/24/23 1234 9     Pain Loc --      Pain Education --      Exclude from Growth Chart --    No data found.  Updated Vital Signs BP 119/84 (BP Location: Right Arm)   Pulse 94   Temp 99 F (37.2 C) (Oral)   Resp 16   LMP 05/26/2023 Comment: skipped july pills\  SpO2 100%   Visual Acuity Right Eye Distance:   Left Eye Distance:   Bilateral Distance:    Right Eye Near:   Left Eye Near:    Bilateral Near:     Physical Exam Vitals and nursing note reviewed.  Constitutional:      General: She is not in acute distress.    Appearance: Normal appearance.  HENT:     Head: Normocephalic.     Right Ear: Tympanic membrane, ear canal and external ear normal.     Left Ear: Tympanic membrane, ear canal and external ear normal.     Nose: Congestion present.     Mouth/Throat:     Mouth: Mucous membranes are moist.     Pharynx: Posterior oropharyngeal erythema present. No oropharyngeal exudate.  Eyes:     Extraocular Movements: Extraocular movements intact.     Conjunctiva/sclera: Conjunctivae  normal.     Pupils: Pupils are equal, round, and reactive to light.  Cardiovascular:     Rate and Rhythm: Normal rate and regular rhythm.     Pulses: Normal pulses.     Heart sounds: Normal heart sounds.  Pulmonary:     Effort: Pulmonary effort is normal. No respiratory distress.     Breath sounds: Normal breath sounds.  No stridor. No wheezing, rhonchi or rales.  Abdominal:     General: Bowel sounds are normal.     Palpations: Abdomen is soft.     Tenderness: There is no abdominal tenderness.  Musculoskeletal:     Cervical back: Normal range of motion.  Lymphadenopathy:     Cervical: No cervical adenopathy.  Skin:    General: Skin is warm and dry.  Neurological:     General: No focal deficit present.     Mental Status: She is alert and oriented to person, place, and time.  Psychiatric:        Mood and Affect: Mood normal.        Behavior: Behavior normal.      UC Treatments / Results  Labs (all labs ordered are listed, but only abnormal results are displayed) Labs Reviewed  SARS CORONAVIRUS 2 (TAT 6-24 HRS)  CULTURE, GROUP A STREP [facility])  POCT RAPID STREP A (OFFICE)    EKG   Radiology No results found.  Procedures Procedures (including critical care time)  Medications Ordered in UC Medications - No data to display  Initial Impression / Assessment and Plan / UC Course  I have reviewed the triage vital signs and the nursing notes.  Pertinent labs & imaging results that were available during my care of the patient were reviewed by me and considered in my medical decision making (see chart for details).   The patient is well-appearing, she is in no acute distress, vital signs are stable.  Rapid strep test is negative, throat culture and COVID test are pending.  If the COVID test is positive, patient is able to receive Paxlovid.  Suspect viral etiology pending the culture result.  Will provide symptomatic treatment with Bromfed-DM for the cough, and Atrovent 0.03%  nasal spray for nasal congestion and runny nose.  Supportive care recommendations were provided and discussed with the patient to include over-the-counter analgesics for pain or discomfort, warm salt water gargles, and normal saline nasal spray to help with nasal congestion and runny nose.  Patient is in agreement with this plan of care and verbalizes understanding.  All questions were answered.  Patient stable for discharge.  Work note was provided.   Final Clinical Impressions(s) / UC Diagnoses   Final diagnoses:  Viral upper respiratory tract infection with cough  Exposure to COVID-19 virus     Discharge Instructions      The rapid strep test was negative, COVID test throat culture are pending.  You will be contacted if the pending test results are abnormal. Take medication as prescribed. Increase fluids and allow for plenty of rest. Recommend Tylenol or ibuprofen as needed for pain, fever, or general discomfort. Recommend throat lozenges, Chloraseptic or honey to help with throat pain. Warm salt water gargles 3-4 times daily to help with throat pain or discomfort. Recommend a diet with soft foods to include soups, broths, puddings, yogurt, Jell-O's, or popsicles until symptoms improve. Follow-up with your primary care physician or in this clinic if symptoms not improve over the next 7 to 10 days. Follow-up as needed.     ED Prescriptions     Medication Sig Dispense Auth. Provider   brompheniramine-pseudoephedrine-DM 30-2-10 MG/5ML syrup Take 5 mLs by mouth 4 (four) times daily as needed. 140 mL Jennise Both-Warren, Sadie Haber, NP   ipratropium (ATROVENT) 0.03 % nasal spray Place 2 sprays into both nostrils every 12 (twelve) hours. 30 mL Laveta Gilkey-Warren, Sadie Haber, NP      PDMP not reviewed  this encounter.   Abran Cantor, NP 07/24/23 1250

## 2023-07-24 NOTE — ED Triage Notes (Signed)
Pt reports bilateral ear pain,, body aches, slight abdominal pain,and sore throat x 1 day

## 2023-07-25 ENCOUNTER — Ambulatory Visit: Payer: No Typology Code available for payment source | Admitting: Nurse Practitioner

## 2023-09-04 ENCOUNTER — Ambulatory Visit
Admission: EM | Admit: 2023-09-04 | Discharge: 2023-09-04 | Disposition: A | Discharge status: Home / Self Care | Payer: No Typology Code available for payment source

## 2023-09-04 DIAGNOSIS — H6992 Unspecified Eustachian tube disorder, left ear: Secondary | ICD-10-CM | POA: Diagnosis not present

## 2023-09-04 DIAGNOSIS — H9202 Otalgia, left ear: Secondary | ICD-10-CM | POA: Diagnosis not present

## 2023-09-04 DIAGNOSIS — H6592 Unspecified nonsuppurative otitis media, left ear: Secondary | ICD-10-CM | POA: Diagnosis not present

## 2023-09-04 MED ORDER — PREDNISONE 20 MG PO TABS: 40.0000 mg | ORAL_TABLET | Freq: Every day | ORAL | 0 refills | Status: AC

## 2023-09-04 NOTE — ED Triage Notes (Signed)
Pt presents with left side ear pain that started 2 days ago. Taking tylenol and tramadol with no relief of pain.

## 2023-09-04 NOTE — ED Provider Notes (Signed)
[facility]    CSN: [id number] Arrival date & time: 09/04/23  1352      History   Chief Complaint Chief Complaint  Patient presents with   Otalgia    HPI Paula Holden is a 27 y.o. female.   The history is provided by the patient.   Patient presents for complaints of left ear pain that is been present for the past 2 days.  Patient denies fever, chills, nasal congestion, runny nose, headache, decreased hearing, sore throat, or cough.  States that she did experience dizziness today.  Patient states prior to her symptoms starting, she did have an upper respiratory infection.  She reports she has taken Tylenol and tramadol with minimal relief.  Patient denies history of recurrent ear infections.  Patient reports that she currently takes allergy medication and uses Flonase.  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      Home Medications    Prior to Admission medications   Medication Sig Start Date End Date Taking? Authorizing Provider  albuterol (VENTOLIN HFA) 108 (90 Base) MCG/ACT inhaler Inhale 2 puffs into the lungs every 4 (four) hours as needed. 08/03/23  Yes [provider]  buPROPion (WELLBUTRIN SR) 200 MG 12 hr tablet Take 200 mg by mouth daily. 09/10/21  Yes [provider]  ferrous sulfate 325 (65 FE) MG tablet Take 325 mg by mouth daily. 03/24/23  Yes [provider]  fluticasone (FLONASE) 50 MCG/ACT nasal spray Place 2 sprays into both nostrils daily. 11/12/20  Yes [provider]  gabapentin  (NEURONTIN) 100 MG capsule Take 100 mg by mouth 3 (three) times daily as needed. 07/10/23  Yes [provider]  ipratropium (ATROVENT) 0.03 % nasal spray Place 2 sprays into both nostrils every 12 (twelve) hours. 07/24/23  Yes Lashan Macias-Warren, Sadie Haber, NP  levothyroxine (SYNTHROID) 100 MCG tablet TAKE 1 TABLET BY MOUTH DAILY BEFORE BREAKFAST. 05/09/23  Yes Reardon, Whitney J, NP  montelukast (SINGULAIR) 10 MG tablet Take 1 tablet by mouth daily. 03/18/21  Yes [provider]  ondansetron (ZOFRAN-ODT) 4 MG disintegrating tablet Take 1 tablet (4 mg total) by mouth every 8 (eight) hours as needed for nausea or vomiting. 01/05/23  Yes Particia Nearing, PA-C  predniSONE (DELTASONE) 20 MG tablet Take 2 tablets (40 mg total) by mouth daily with breakfast for 5 days. 09/04/23 09/09/23 Yes Kasin Tonkinson-Warren, Sadie Haber, NP  traMADol (ULTRAM) 50 MG tablet Take 50 mg by mouth 3 (three) times daily. 04/07/21  Yes [provider]  TRI-ESTARYLLA 0.18/0.215/0.25 MG-35 MCG tablet Take 1 tablet by mouth daily. 07/23/21  Yes [provider]  brompheniramine-pseudoephedrine-DM 30-2-10 MG/5ML syrup Take 5 mLs by mouth 4 (four) times daily as needed. 07/24/23   Arietta Eisenstein-Warren, Sadie Haber, NP  clonazePAM (KLONOPIN) 0.5 MG disintegrating tablet Take 0.5 mg by mouth as needed. 05/01/23   [provider]  cyclobenzaprine (FLEXERIL) 5 MG tablet Take 1 tablet (5 mg total) by mouth 3 (three) times daily as needed. Patient taking differently: Take 5 mg by  mouth 3 (three) times daily as needed for muscle spasms. 09/05/22   Vickki Hearing, MD  hydrOXYzine (ATARAX) 25 MG tablet Take 25 mg by mouth 3 (three) times daily as needed for anxiety. 01/30/21   [provider]  metaxalone (SKELAXIN) 800 MG tablet Take 1 tablet (800 mg total) by mouth 3 (three) times daily. 09/05/22   Vickki Hearing, MD  nabumetone (RELAFEN) 500 MG tablet Take 1 tablet (500 mg total) by mouth 2 (two) times daily.  09/05/22   Vickki Hearing, MD  naproxen (NAPROSYN) 500 MG tablet Take 1 tablet (500 mg total) by mouth 2 (two) times daily. 04/08/23   Bethann Berkshire, MD  promethazine-dextromethorphan (PROMETHAZINE-DM) 6.25-15 MG/5ML syrup Take 5 mLs by mouth at bedtime as needed for cough. 02/07/23   Valentino Nose, NP  SUMAtriptan (IMITREX) 50 MG tablet Take 1 tab at onset of migraine. May repeat in 2 hours if headache persists or recurs. Max of 2 tabs daily 01/05/23   Particia Nearing, PA-C  traZODone (DESYREL) 50 MG tablet Take 50 mg by mouth at bedtime. 07/15/21   [provider]  UBRELVY 100 MG TABS Take 1 tablet per pharmacy at onset, repeat if needed 02/16/23   [provider]    Family History Family History  Problem Relation Age of Onset   Hyperlipidemia Mother    Thyroid disease Maternal Grandmother     Social History Social History   Tobacco Use   Smoking status: Never   Smokeless tobacco: Never  Vaping Use   Vaping status: Never Used  Substance Use Topics   Alcohol use: Yes    Comment: Social   Drug use: Never     Allergies   Augmentin [amoxicillin-pot clavulanate] and Prednisone   Review of Systems Review of Systems Per HPI  Physical Exam Triage Vital Signs ED Triage Vitals  Encounter Vitals Group     BP 09/04/23 1407 111/83     Systolic BP Percentile --      Diastolic BP Percentile --      Pulse Rate 09/04/23 1407 81     Resp 09/04/23 1407 16     Temp 09/04/23 1407 98.4 F (36.9 C)     Temp Source 09/04/23 1407 Oral     SpO2 09/04/23 1407 98 %     Weight --      Height --      Head Circumference --      Peak Flow --      Pain Score 09/04/23 1409 10     Pain Loc --      Pain Education --      Exclude from Growth Chart --    No data found.  Updated Vital Signs BP 111/83 (BP Location: Right Arm)   Pulse 81   Temp 98.4 F (36.9 C) (Oral)   Resp 16   LMP 08/25/2023 (Approximate)   SpO2 98%   Visual Acuity Right Eye Distance:    Left Eye Distance:   Bilateral Distance:    Right Eye Near:   Left Eye Near:    Bilateral Near:     Physical Exam Vitals and nursing note reviewed.  Constitutional:      General: She is not in acute distress.    Appearance: Normal appearance.  HENT:     Head: Normocephalic.     Right Ear: Hearing, ear canal and external ear normal. A middle ear effusion is present.     Left Ear: Hearing,  ear canal and external ear normal. A middle ear effusion is present.     Nose: Congestion present.     Mouth/Throat:     Mouth: Mucous membranes are moist.     Pharynx: No posterior oropharyngeal erythema.  Eyes:     Extraocular Movements: Extraocular movements intact.     Conjunctiva/sclera: Conjunctivae normal.     Pupils: Pupils are equal, round, and reactive to light.  Cardiovascular:     Rate and Rhythm: Normal rate and regular rhythm.     Pulses: Normal pulses.     Heart sounds: Normal heart sounds.  Pulmonary:     Effort: Pulmonary effort is normal. No respiratory distress.     Breath sounds: Normal breath sounds. No stridor. No wheezing, rhonchi or rales.  Abdominal:     General: Bowel sounds are normal.     Palpations: Abdomen is soft.     Tenderness: There is no abdominal tenderness.  Musculoskeletal:     Cervical back: Normal range of motion.  Lymphadenopathy:     Cervical: No cervical adenopathy.  Skin:    General: Skin is warm and dry.  Neurological:     General: No focal deficit present.     Mental Status: She is alert and oriented to person, place, and time.  Psychiatric:        Mood and Affect: Mood normal.        Behavior: Behavior normal.      UC Treatments / Results  Labs (all labs ordered are listed, but only abnormal results are displayed) Labs Reviewed - No data to display  EKG   Radiology No results found.  Procedures Procedures (including critical care time)  Medications Ordered in UC Medications - No data to display  Initial Impression /  Assessment and Plan / UC Course  I have reviewed the triage vital signs and the nursing notes.  Pertinent labs & imaging results that were available during my care of the patient were reviewed by me and considered in my medical decision making (see chart for details).  The patient is well-appearing, she is in no acute distress, vital signs are stable.  On exam, patient with bilateral middle ear effusions.  There is no bulging or erythema of the tympanic membrane.  Will treat symptomatically with prednisone 40 mg for the next 5 days to help with eustachian tube swelling and inflammation.  Patient advised to continue use of her current allergy medication and Flonase.  Supportive care recommendations were provided and discussed with the patient to include continuing over-the-counter analgesics, warm compresses to the affected ear, and avoidance of entrance of water inside of the ear while symptoms persist.  Patient was advised that if symptoms do not improve with this treatment, she will need to follow-up with ENT for further evaluation.  Patient is in agreement with this plan of care and verbalizes understanding.  All questions were answered.  Patient stable for discharge.  Work note was provided.  Final Clinical Impressions(s) / UC Diagnoses   Final diagnoses:  Acute otalgia, left  Acute dysfunction of left eustachian tube  Middle ear effusion, left     Discharge Instructions      Take medication as prescribed. Continue over-the-counter allergy medication and Flonase daily while symptoms persist.  As discussed once you have completed the prednisone, if you continue to experience symptoms, recommend Sudafed daily for continued symptoms.   Warm compresses to the affected ear help with comfort. Do not stick anything inside the ear  while symptoms persist. Avoid getting water inside of the ear while symptoms persist. If symptoms fail to improve with this treatment, please follow-up with your  primary care physician or with ENT for further evaluation. Follow-up as needed.     ED Prescriptions     Medication Sig Dispense Auth. Provider   predniSONE (DELTASONE) 20 MG tablet Take 2 tablets (40 mg total) by mouth daily with breakfast for 5 days. 10 tablet Gage Weant-Warren, Sadie Haber, NP      PDMP not reviewed this encounter.   Abran Cantor, NP 09/04/23 1442

## 2023-09-04 NOTE — Discharge Instructions (Signed)
Take medication as prescribed. Continue over-the-counter allergy medication and Flonase daily while symptoms persist.  As discussed once you have completed the prednisone, if you continue to experience symptoms, recommend Sudafed daily for continued symptoms.   Warm compresses to the affected ear help with comfort. Do not stick anything inside the ear while symptoms persist. Avoid getting water inside of the ear while symptoms persist. If symptoms fail to improve with this treatment, please follow-up with your primary care physician or with ENT for further evaluation. Follow-up as needed.

## 2023-09-13 ENCOUNTER — Other Ambulatory Visit: Payer: Self-pay | Admitting: Nurse Practitioner

## 2023-09-13 DIAGNOSIS — E890 Postprocedural hypothyroidism: Secondary | ICD-10-CM

## 2023-10-23 ENCOUNTER — Telehealth: Payer: Self-pay | Admitting: Nurse Practitioner

## 2023-10-23 DIAGNOSIS — E890 Postprocedural hypothyroidism: Secondary | ICD-10-CM

## 2023-10-23 MED ORDER — LEVOTHYROXINE SODIUM 100 MCG PO TABS
100.0000 ug | ORAL_TABLET | Freq: Every day | ORAL | 0 refills | Status: DC
Start: 2023-10-23 — End: 2023-12-29

## 2023-10-23 NOTE — Telephone Encounter (Signed)
Pt made an appt for your next slot, January. She said she has not been back because her bill was over 600.00 at [facility]. She is asking for a refill to get her to that appt. [facility]

## 2023-10-23 NOTE — Telephone Encounter (Signed)
I can do it this last time only.  She will be required to have a follow up appointment before any future refills.

## 2023-12-19 ENCOUNTER — Ambulatory Visit: Payer: No Typology Code available for payment source | Admitting: Nurse Practitioner

## 2023-12-22 ENCOUNTER — Ambulatory Visit: Payer: No Typology Code available for payment source | Admitting: Nurse Practitioner

## 2023-12-27 NOTE — Patient Instructions (Signed)

## 2023-12-28 LAB — TSH: TSH: 1.19 u[IU]/mL (ref 0.450–4.500)

## 2023-12-28 LAB — T4, FREE: Free T4: 1.23 ng/dL (ref 0.82–1.77)

## 2023-12-29 ENCOUNTER — Ambulatory Visit (INDEPENDENT_AMBULATORY_CARE_PROVIDER_SITE_OTHER): Payer: No Typology Code available for payment source | Admitting: Nurse Practitioner

## 2023-12-29 ENCOUNTER — Encounter: Payer: Self-pay | Admitting: Nurse Practitioner

## 2023-12-29 VITALS — BP 114/79 | HR 77 | Ht 68.0 in | Wt 211.4 lb

## 2023-12-29 DIAGNOSIS — E890 Postprocedural hypothyroidism: Secondary | ICD-10-CM

## 2023-12-29 DIAGNOSIS — Z833 Family history of diabetes mellitus: Secondary | ICD-10-CM | POA: Diagnosis not present

## 2023-12-29 MED ORDER — SYNTHROID 100 MCG PO TABS: 100.0000 ug | ORAL_TABLET | Freq: Every day | ORAL | 1 refills | Status: DC

## 2023-12-29 NOTE — Progress Notes (Signed)
12/29/2023     Endocrinology Follow Up Note     Subjective:    Patient ID: Paula Holden, female    DOB: 14-Apr-1996, PCP Lenoria Chime, FNP.   Past Medical History:  Diagnosis Date   Graves disease     Past Surgical History:  Procedure Laterality Date   ADENOIDECTOMY     TONSILLECTOMY      Social History   Socioeconomic History   Marital status: Single    Spouse name: Not on file   Number of children: Not on file   Years of education: Not on file   Highest education level: Not on file  Occupational History   Not on file  Tobacco Use   Smoking status: Never   Smokeless tobacco: Never  Vaping Use   Vaping status: Never Used  Substance and Sexual Activity   Alcohol use: Yes    Comment: Social   Drug use: Never   Sexual activity: Not Currently    Birth control/protection: Pill  Other Topics Concern   Not on file  Social History Narrative   Not on file   Social Drivers of Health   Financial Resource Strain: Not on file  Food Insecurity: Not on file  Transportation Needs: Not on file  Physical Activity: Not on file  Stress: Not on file  Social Connections: Not on file    Family History  Problem Relation Age of Onset   Hyperlipidemia Mother    Thyroid disease Maternal Grandmother     Outpatient Encounter Medications as of 12/29/2023  Medication Sig   albuterol (VENTOLIN HFA) 108 (90 Base) MCG/ACT inhaler Inhale 2 puffs into the lungs every 4 (four) hours as needed.   buPROPion (WELLBUTRIN SR) 200 MG 12 hr tablet Take 200 mg by mouth daily.   Butalbital-APAP-Caffeine 50-300-40 MG CAPS Take by mouth as needed.   clonazePAM (KLONOPIN) 0.5 MG disintegrating tablet Take 0.5 mg by mouth as needed.   cyclobenzaprine (FLEXERIL) 5 MG tablet Take 1 tablet (5 mg total) by mouth 3 (three) times daily as needed. (Patient taking differently: Take 5 mg by mouth 3 (three) times daily as needed for muscle spasms.)   fluticasone (FLONASE) 50 MCG/ACT nasal  spray Place 2 sprays into both nostrils daily.   gabapentin (NEURONTIN) 100 MG capsule Take 100 mg by mouth 3 (three) times daily as needed.   ipratropium (ATROVENT) 0.03 % nasal spray Place 2 sprays into both nostrils every 12 (twelve) hours.   metaxalone (SKELAXIN) 800 MG tablet Take 1 tablet (800 mg total) by mouth 3 (three) times daily.   nabumetone (RELAFEN) 500 MG tablet Take 1 tablet (500 mg total) by mouth 2 (two) times daily.   naproxen (NAPROSYN) 500 MG tablet Take 1 tablet (500 mg total) by mouth 2 (two) times daily.   ondansetron (ZOFRAN-ODT) 4 MG disintegrating tablet Take 1 tablet (4 mg total) by mouth every 8 (eight) hours as needed for nausea or vomiting.   promethazine-dextromethorphan (PROMETHAZINE-DM) 6.25-15 MG/5ML syrup Take 5 mLs by mouth at bedtime as needed for cough.   TRI-ESTARYLLA 0.18/0.215/0.25 MG-35 MCG tablet Take 1 tablet by mouth daily.   [DISCONTINUED] levothyroxine (SYNTHROID) 100 MCG tablet Take 1 tablet (100 mcg total) by mouth daily before breakfast.   [DISCONTINUED] SYNTHROID 100 MCG tablet Take 1 tablet (100 mcg total) by mouth daily before breakfast.   ferrous sulfate 325 (65 FE) MG tablet Take 325 mg by mouth daily. (Patient not taking: Reported on 12/29/2023)  SYNTHROID 100 MCG tablet Take 1 tablet (100 mcg total) by mouth daily before breakfast.   UBRELVY 100 MG TABS Take 1 tablet per pharmacy at onset, repeat if needed (Patient not taking: Reported on 12/29/2023)   [DISCONTINUED] brompheniramine-pseudoephedrine-DM 30-2-10 MG/5ML syrup Take 5 mLs by mouth 4 (four) times daily as needed. (Patient not taking: Reported on 12/29/2023)   [DISCONTINUED] hydrOXYzine (ATARAX) 25 MG tablet Take 25 mg by mouth 3 (three) times daily as needed for anxiety. (Patient not taking: Reported on 12/29/2023)   [DISCONTINUED] montelukast (SINGULAIR) 10 MG tablet Take 1 tablet by mouth daily. (Patient not taking: Reported on 12/29/2023)   [DISCONTINUED] SUMAtriptan (IMITREX) 50 MG  tablet Take 1 tab at onset of migraine. May repeat in 2 hours if headache persists or recurs. Max of 2 tabs daily (Patient not taking: Reported on 12/29/2023)   [DISCONTINUED] traMADol (ULTRAM) 50 MG tablet Take 50 mg by mouth 3 (three) times daily. (Patient not taking: Reported on 12/29/2023)   [DISCONTINUED] traZODone (DESYREL) 50 MG tablet Take 50 mg by mouth at bedtime. (Patient not taking: Reported on 12/29/2023)   No facility-administered encounter medications on file as of 12/29/2023.    ALLERGIES: Allergies  Allergen Reactions   Augmentin [Amoxicillin-Pot Clavulanate] Nausea And Vomiting   Prednisone Anxiety    VACCINATION STATUS:  There is no immunization history on file for this patient.   HPI Thyroid Problem Presents for follow-up visit. Symptoms include weight gain. Patient reports no anxiety, cold intolerance, constipation, depressed mood, fatigue, leg swelling, palpitations or tremors. The symptoms have been stable.     Paula Holden is 28 y.o. female who presents today with a medical history as above. she is being seen in follow up after being seen in consultation for hyperthyroidism requested by Lenoria Chime, FNP.    she denies dysphagia, choking, shortness of breath, no recent voice change.    she does have family history of thyroid dysfunction in her cousin (Graves disease), maternal grandmother (Hypo), and great aunt (unknown), but denies family hx of thyroid cancer. she denies personal history of goiter. she is not on any anti-thyroid medications nor on any thyroid hormone supplements. She does endorse use of Biotin containing supplements.    She had RAI ablation for Graves disease on 04/01/21.  Review of systems  Constitutional: + stable body weight,  current Body mass index is 32.14 kg/m. , no fatigue, no subjective hyperthermia, no subjective hypothermia Eyes: no blurry vision, no xerophthalmia ENT: no sore throat, no nodules palpated in throat, no  dysphagia/odynophagia, no hoarseness Cardiovascular: no chest pain, no shortness of breath, no palpitations, no leg swelling Respiratory: no cough, no shortness of breath Gastrointestinal: no nausea/vomiting/diarrhea Musculoskeletal: no muscle/joint aches Skin: no rashes, no hyperemia Neurological: no tremors, no numbness, no tingling, no dizziness Psychiatric: no depression, no anxiety   Objective:    BP 114/79 (BP Location: Left Arm, Patient Position: Sitting, Cuff Size: Large)   Pulse 77   Ht 5\' 8"  (1.727 m)   Wt 211 lb 6.4 oz (95.9 kg)   BMI 32.14 kg/m   Wt Readings from Last 3 Encounters:  12/29/23 211 lb 6.4 oz (95.9 kg)  04/08/23 200 lb (90.7 kg)  09/21/22 214 lb (97.1 kg)     BP Readings from Last 3 Encounters:  12/29/23 114/79  09/04/23 111/83  07/24/23 119/84     Physical Exam- Limited  Constitutional:  Body mass index is 32.14 kg/m. , not in acute distress, normal state of mind Eyes:  EOMI, no exophthalmos Musculoskeletal: no gross deformities, strength intact in all four extremities, no gross restriction of joint movements Skin:  no rashes, no hyperemia Neurological: no tremor with outstretched hands   CMP     Component Value Date/Time   NA 139 03/21/2022 0838   K 4.0 03/21/2022 0838   CL 106 03/21/2022 0838   CO2 20 03/21/2022 0838   GLUCOSE 82 03/21/2022 0838   BUN 8 03/21/2022 0838   CREATININE 0.77 03/21/2022 0838   CALCIUM 8.5 (L) 03/21/2022 0838   PROT 6.2 03/21/2022 0838   ALBUMIN 4.0 03/21/2022 0838   AST 12 03/21/2022 0838   ALT 11 03/21/2022 0838   ALKPHOS 70 03/21/2022 0838   BILITOT 0.5 03/21/2022 0838     CBC No results found for: "WBC", "RBC", "HGB", "HCT", "PLT", "MCV", "MCH", "MCHC", "RDW", "LYMPHSABS", "MONOABS", "EOSABS", "BASOSABS"   Diabetic Labs (most recent): Lab Results  Component Value Date   HGBA1C 5.1 03/21/2022    Lipid Panel  No results found for: "CHOL", "TRIG", "HDL", "CHOLHDL", "VLDL", "LDLCALC",  "LDLDIRECT", "LABVLDL"   Lab Results  Component Value Date   TSH 1.190 12/27/2023   TSH 1.65 08/24/2022   TSH 1.420 03/21/2022   TSH 4.240 12/22/2021   TSH 19.600 (H) 09/09/2021   TSH 42.800 (H) 07/15/2021   TSH 0.267 (L) 05/07/2021   TSH 2.595 04/01/2021   TSH 2.260 03/19/2021   TSH 0.15 (A) 03/04/2021   FREET4 1.23 12/27/2023   FREET4 1.13 03/21/2022   FREET4 1.19 12/22/2021   FREET4 0.88 09/09/2021   FREET4 0.37 (L) 07/15/2021   FREET4 1.05 05/07/2021   FREET4 0.66 04/01/2021   FREET4 0.99 03/19/2021      Uptake and scan 03/19/21  CLINICAL DATA:  Suppressed TSH suspicious for hyperthyroidism, neck swelling and tenderness, difficulty swallowing, heat intolerance, difficulty sleeping, hand tremors, irritability and mood venous, increased perspiration, heart palpitations, weakness, dry skin   EXAM: THYROID SCAN AND UPTAKE - 4 AND 24 HOURS   TECHNIQUE: Following oral administration of I-123 capsule, anterior planar imaging was acquired at 24 hours. Thyroid uptake was calculated with a thyroid probe at 4-6 hours and 24 hours.   RADIOPHARMACEUTICALS:  304 uCi I-123 sodium iodide p.o.   COMPARISON:  None   FINDINGS: Homogeneous tracer distribution in both thyroid lobes.   No focal areas of increased or decreased tracer localization seen.   4 hour I-123 uptake = 23.5% (normal 5-20%)   24 hour I-123 uptake = 43.4% (normal 10-30%)   IMPRESSION: Normal thyroid scan.   Elevated 4 hour and 24 hour radio iodine uptakes consistent with hyperthyroidism.   Overall findings consistent with Graves disease.     Electronically Signed   By: Ulyses Southward M.D.   On: 03/19/2021 12:56   TSH TSH Resulted: 08/24/22 0000  Result status: Final  Resulting lab: [facility]  Reference range: 0.41 - 5.90  Value: 1.65  Comment: T4 10.6, T3 Uptake 19, T4,Free 2.0    Latest Reference Range & Units 07/15/21 10:22 09/09/21 10:29 12/22/21 08:06 03/21/22 08:38 08/24/22 00:00 12/27/23  10:16  TSH 0.450 - 4.500 uIU/mL 42.800 (H) 19.600 (H) 4.240 1.420 1.65 (E) 1.190  Triiodothyronine,Free,Serum 2.0 - 4.4 pg/mL 1.6 (L) 2.7      T4,Free(Direct) 0.82 - 1.77 ng/dL 3.47 (L) 4.25 9.56 3.87  1.23  (H): Data is abnormally high (L): Data is abnormally low (E): External lab result  Assessment & Plan:   1. Hypothyroidism s/p RAI ablation for Graves disease  she  is being seen at a kind request of White, Raenette Rover, FNP.  She is status post RAI on 04/01/21.     Her previsit thyroid function tests are consistent with appropriate hormone replacement.  She is advised to continue Levothyroxine 100 mcg po daily before breakfast.   She has concerns that Levothyroxine recently had a recall.  She is still using the same [facility] number that was recalled.  I changed her to branded Synthroid at same dose of 100 mcg po daily for now.   - The correct intake of thyroid hormone (Levothyroxine, Synthroid), is on empty stomach first thing in the morning, with water, separated by at least 30 minutes from breakfast and other medications,  and separated by more than 4 hours from calcium, iron, multivitamins, acid reflux medications (PPIs).  - This medication is a life-long medication and will be needed to correct thyroid hormone imbalances for the rest of your life.  The dose may change from time to time, based on thyroid blood work.  - It is extremely important to be consistent taking this medication, near the same time each morning.  -AVOID TAKING PRODUCTS CONTAINING BIOTIN (commonly found in Hair, Skin, Nails vitamins) AS IT INTERFERES WITH THE VALIDITY OF THYROID FUNCTION BLOOD TESTS.  2. Family history of Type 2 Diabetes   She does have extensive family history of diabetes.  Will recheck A1c prior to next visit to assess her risk.  - Nutritional counseling repeated at each appointment due to patients tendency to fall back in to old habits.  -  Suggestion is made for the patient to avoid simple  carbohydrates from their diet including Cakes, Sweet Desserts / Pastries, Ice Cream, Soda (diet and regular), Sweet Tea, Candies, Chips, Cookies, Sweet Pastries, Store Bought Juices, Alcohol in Excess of 1-2 drinks a day, Artificial Sweeteners, Coffee Creamer, and "Sugar-free" Products. This will help patient to have stable blood glucose profile and potentially avoid unintended weight gain.   - I encouraged the patient to switch to unprocessed or minimally processed complex starch and increased protein intake (animal or plant source), fruits, and vegetables.   - Patient is advised to stick to a routine mealtimes to eat 3 meals a day and avoid unnecessary snacks (to snack only to correct hypoglycemia).     -Patient is advised to maintain close follow up with Lenoria Chime, FNP for primary care needs.    I spent  20  minutes in the care of the patient today including review of labs from Thyroid Function, CMP, and other relevant labs ; imaging/biopsy records (current and previous including abstractions from other facilities); face-to-face time discussing  her lab results and symptoms, medications doses, her options of short and long term treatment based on the latest standards of care / guidelines;   and documenting the encounter.  [facility]  participated in the discussions, expressed understanding, and voiced agreement with the above plans.  All questions were answered to her satisfaction. she is encouraged to contact clinic should she have any questions or concerns prior to her return visit.    Follow up plan: Return in about 6 months (around 06/27/2024) for Thyroid follow up, Previsit labs.  Ronny Bacon, [facility] [address] [place], [place] [address] Phone: [phone] Fax: [phone]  12/29/2023, 10:24 AM

## 2023-12-30 ENCOUNTER — Encounter: Payer: Self-pay | Admitting: Nurse Practitioner

## 2024-01-01 ENCOUNTER — Ambulatory Visit
Admission: EM | Admit: 2024-01-01 | Discharge: 2024-01-01 | Disposition: A | Discharge status: Home / Self Care | Payer: No Typology Code available for payment source

## 2024-01-01 ENCOUNTER — Telehealth: Payer: Self-pay

## 2024-01-01 ENCOUNTER — Other Ambulatory Visit (HOSPITAL_COMMUNITY): Payer: Self-pay

## 2024-01-01 DIAGNOSIS — R109 Unspecified abdominal pain: Secondary | ICD-10-CM

## 2024-01-01 DIAGNOSIS — R42 Dizziness and giddiness: Secondary | ICD-10-CM

## 2024-01-01 LAB — POCT URINALYSIS DIP (MANUAL ENTRY)
Bilirubin, UA: NEGATIVE
Glucose, UA: NEGATIVE mg/dL
Leukocytes, UA: NEGATIVE
Nitrite, UA: NEGATIVE
Protein Ur, POC: NEGATIVE mg/dL
Spec Grav, UA: >=1.03 — AB (ref 1.010–1.025)
Urobilinogen, UA: 0.2 U/dL
pH, UA: 6 (ref 5.0–8.0)

## 2024-01-01 LAB — POCT FASTING CBG KUC MANUAL ENTRY: POCT Glucose (KUC): 64 mg/dL — AB (ref 70–99)

## 2024-01-01 LAB — POCT URINE PREGNANCY: Preg Test, Ur: NEGATIVE

## 2024-01-01 NOTE — ED Provider Notes (Signed)
[facility]    CSN: [id number] Arrival date & time: 01/01/24  1608      History   Chief Complaint Chief Complaint  Patient presents with   Dizziness    HPI Paula Holden is a 28 y.o. female.   Presenting today with 2 to 3-week history of intermittent bouts of lightheadedness, dizziness, nausea.  Denies any notice of a pattern to her symptoms, often occurring with standing up but sometimes when laying down as well.  Does not seem to be associated with eating or drinking or not eating.  Denies associated chest pain, shortness of breath, palpitations, headache, visual change, mental status changes, head injuries.  Also occasionally having some abdominal cramping that comes and goes.  Not trying anything for symptoms thus far.    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      Home Medications    Prior to Admission medications   Medication Sig Start Date End Date Taking? Authorizing Provider  buPROPion (WELLBUTRIN SR) 200 MG 12 hr tablet Take 200 mg by mouth daily. 09/10/21  Yes [provider]  levothyroxine (SYNTHROID) 100 MCG tablet Take 100 mcg by mouth daily before breakfast.   Yes [provider]  TRI-ESTARYLLA 0.18/0.215/0.25 MG-35 MCG tablet Take 1 tablet by mouth daily. 07/23/21  Yes [provider]  Vitamin D, Ergocalciferol, (DRISDOL) 1.25 MG (50000 UNIT) CAPS capsule Take 50,000 Units by mouth once a week. 12/03/23  Yes [provider]  albuterol (VENTOLIN HFA) 108 (90 Base) MCG/ACT inhaler Inhale  2 puffs into the lungs every 4 (four) hours as needed. 08/03/23   [provider]  Butalbital-APAP-Caffeine 50-300-40 MG CAPS Take by mouth as needed.    [provider]  clonazePAM (KLONOPIN) 0.5 MG disintegrating tablet Take 0.5 mg by mouth as needed. 05/01/23   [provider]  Cyanocobalamin (B-12) 1000 MCG TBCR Take 1 tablet by mouth daily.    [provider]  cyclobenzaprine (FLEXERIL) 5 MG tablet Take 1 tablet (5 mg total) by mouth 3 (three) times daily as needed. Patient taking differently: Take 5 mg by mouth 3 (three) times daily as needed for muscle spasms. 09/05/22   Vickki Hearing, MD  ferrous sulfate 325 (65 FE) MG tablet Take 325 mg by mouth daily. Patient not taking: Reported on 12/29/2023 03/24/23   [provider]  fluticasone (FLONASE) 50 MCG/ACT nasal spray Place 2 sprays into both nostrils daily. 11/12/20   [provider]  gabapentin (NEURONTIN) 100 MG capsule Take 100 mg by mouth 3 (three) times daily as needed. Patient not taking: Reported on 01/01/2024 07/10/23   [provider]  ipratropium (ATROVENT) 0.03 % nasal spray Place 2 sprays into both nostrils every 12 (twelve) hours. 07/24/23   Leath-Warren, Sadie Haber, NP  metaxalone (SKELAXIN) 800 MG tablet Take 1 tablet (800 mg total) by mouth 3 (three) times daily. 09/05/22   Vickki Hearing, MD  nabumetone (RELAFEN) 500 MG tablet Take 1 tablet (500 mg total) by mouth 2 (two) times daily.  09/05/22   Vickki Hearing, MD  naproxen (NAPROSYN) 500 MG tablet Take 1 tablet (500 mg total) by mouth 2 (two) times daily. 04/08/23   Bethann Berkshire, MD  ondansetron (ZOFRAN-ODT) 4 MG disintegrating tablet Take 1 tablet (4 mg total) by mouth every 8 (eight) hours as needed for nausea or vomiting. 01/05/23   Particia Nearing, PA-C  promethazine-dextromethorphan (PROMETHAZINE-DM) 6.25-15 MG/5ML syrup Take 5 mLs by mouth at bedtime as needed for cough. 02/07/23   Valentino Nose, NP  SYNTHROID 100 MCG tablet Take 1 tablet (100 mcg total) by mouth daily before breakfast. 12/29/23   Dani Gobble, NP  UBRELVY 100 MG TABS Take 1 tablet per pharmacy at onset, repeat if needed Patient not taking: Reported on 12/29/2023 02/16/23   [provider]    Family History Family History  Problem Relation Age of Onset   Hyperlipidemia Mother    Thyroid disease Maternal Grandmother     Social History Social History   Tobacco Use   Smoking status: Never   Smokeless tobacco: Never  Vaping Use   Vaping status: Never Used  Substance Use Topics   Alcohol use: Yes    Comment: Social   Drug use: Never     Allergies   Augmentin [amoxicillin-pot clavulanate] and Prednisone   Review of Systems Review of Systems Per HPI  Physical Exam Triage Vital Signs ED Triage Vitals  Encounter Vitals Group     BP 01/01/24 1649 114/79     Systolic BP Percentile --      Diastolic BP Percentile --      Pulse Rate 01/01/24 1649 88     Resp 01/01/24 1649 16     Temp 01/01/24 1649 98.7 F (37.1 C)     Temp Source 01/01/24 1649 Oral     SpO2 01/01/24 1649 98 %     Weight --      Height --      Head Circumference --      Peak Flow --      Pain Score 01/01/24 1652 4     Pain Loc --      Pain Education --      Exclude from Growth Chart --    Orthostatic VS for the past 24 hrs:  BP- Lying Pulse- Lying BP- Sitting Pulse- Sitting BP- Standing at 0 minutes Pulse- Standing at 0 minutes  01/01/24 1716 117/80 80 114/81 77 117/83 88    Updated Vital Signs BP 114/79 (BP Location: Right Arm)   Pulse 88   Temp 98.7 F (37.1 C) (Oral)   Resp 16   LMP 12/15/2023 (Exact Date)   SpO2 98%   Visual Acuity Right Eye Distance:   Left Eye Distance:   Bilateral Distance:    Right Eye Near:   Left Eye Near:    Bilateral Near:     Physical Exam Vitals and nursing note reviewed.  Constitutional:      Appearance: Normal appearance. She is not ill-appearing.   HENT:     Head: Atraumatic.     Mouth/Throat:     Mouth: Mucous membranes are moist.     Pharynx: Oropharynx is clear.  Eyes:     Extraocular Movements: Extraocular movements intact.     Conjunctiva/sclera: Conjunctivae normal.     Pupils: Pupils are equal, round, and reactive to light.  Cardiovascular:     Rate and Rhythm: Normal rate and regular rhythm.     Heart sounds: Normal heart  sounds.  Pulmonary:     Effort: Pulmonary effort is normal.     Breath sounds: Normal breath sounds.  Musculoskeletal:        General: Normal range of motion.     Cervical back: Normal range of motion and neck supple.  Skin:    General: Skin is warm and dry.  Neurological:     General: No focal deficit present.     Mental Status: She is alert and oriented to person, place, and time.     Cranial Nerves: No cranial nerve deficit.     Motor: No weakness.     Gait: Gait normal.  Psychiatric:        Mood and Affect: Mood normal.        Thought Content: Thought content normal.        Judgment: Judgment normal.      UC Treatments / Results  Labs (all labs ordered are listed, but only abnormal results are displayed) Labs Reviewed  POCT URINALYSIS DIP (MANUAL ENTRY) - Abnormal; Notable for the following components:      Result Value   Ketones, POC UA small (15) (*)    Spec Grav, UA >=1.030 (*)    Blood, UA moderate (*)    All other components within normal limits  POCT FASTING CBG [facility] MANUAL ENTRY - Abnormal; Notable for the following components:   POCT Glucose ([facility]) 64 (*)    All other components within normal limits  COMPREHENSIVE METABOLIC PANEL  CBC WITH DIFFERENTIAL/PLATELET  POCT URINE PREGNANCY    EKG   Radiology No results found.  Procedures Procedures (including critical care time)  Medications Ordered in UC Medications - No data to display  Initial Impression / Assessment and Plan / UC Course  I have reviewed the triage vital signs and the nursing notes.  Pertinent  labs & imaging results that were available during my care of the patient were reviewed by me and considered in my medical decision making (see chart for details).     Vital signs and exam reassuring today, point-of-care glucose 64 but patient states she had not eaten since breakfast.  Orthostatic vital signs with no significant change.  Urinalysis without evidence of urinary tract infection, possibly some very mild dehydration.  Labs pending for further evaluation.  Discussed increasing hydration, increasing protein and fiber intake and following up with primary care for further evaluation.  Return for worsening symptoms.  Final Clinical Impressions(s) / UC Diagnoses   Final diagnoses:  Dizziness  Abdominal cramping     Discharge Instructions      Your workup today is assuring.  As discussed make sure to stay well-hydrated, eat plenty of protein and fiber to stabilize her blood sugars and follow-up with your primary care provider soon as possible for a recheck of your symptoms.  We will have your blood work back in the next day or so and we will let you know if anything comes back abnormal.    ED Prescriptions   None    PDMP not reviewed this encounter.   Particia Nearing, [place] 01/01/24 1810

## 2024-01-01 NOTE — Telephone Encounter (Signed)
Patient notes PA is needed for her Synthroid.  We changed from Levothyroxine due to recall.  She is aware we may not get it covered on those grounds but still wanted [place] to try.

## 2024-01-01 NOTE — Discharge Instructions (Signed)
Your workup today is assuring.  As discussed make sure to stay well-hydrated, eat plenty of protein and fiber to stabilize her blood sugars and follow-up with your primary care provider soon as possible for a recheck of your symptoms.  We will have your blood work back in the next day or so and we will let you know if anything comes back abnormal.

## 2024-01-01 NOTE — Telephone Encounter (Signed)
Pharmacy Patient Advocate Encounter   Received notification from Patient Advice Request messages that prior authorization for Synthroid is required/requested.   Insurance verification completed.   The patient is insured through [employer] .   Per test claim: PA required; PA submitted to above mentioned insurance via CoverMyMeds Key/confirmation #/EOC [id number] Status is pending

## 2024-01-01 NOTE — ED Triage Notes (Signed)
Pt states she has been lightheaded and dizzy with nausea that comes and goes for 2-3 weeks. Pt states the dizziness gets worse when standing or bending down but some times she will feel dizzy when laying down as well. Pt states she had some lower abdominal cramping that comes and goes for the past week.

## 2024-01-02 LAB — COMPREHENSIVE METABOLIC PANEL
ALT: 8 [IU]/L (ref 0–32)
AST: 11 [IU]/L (ref 0–40)
Albumin: 4.1 g/dL (ref 4.0–5.0)
Alkaline Phosphatase: 69 [IU]/L (ref 44–121)
BUN/Creatinine Ratio: 13 (ref 9–23)
BUN: 10 mg/dL (ref 6–20)
Bilirubin Total: 0.6 mg/dL (ref 0.0–1.2)
CO2: 21 mmol/L (ref 20–29)
Calcium: 9.2 mg/dL (ref 8.7–10.2)
Chloride: 100 mmol/L (ref 96–106)
Creatinine, Ser: 0.8 mg/dL (ref 0.57–1.00)
Globulin, Total: 2.3 g/dL (ref 1.5–4.5)
Glucose: 70 mg/dL (ref 70–99)
Potassium: 4.4 mmol/L (ref 3.5–5.2)
Sodium: 137 mmol/L (ref 134–144)
Total Protein: 6.4 g/dL (ref 6.0–8.5)
eGFR: 103 mL/min/{1.73_m2} (ref >59)

## 2024-01-02 LAB — CBC WITH DIFFERENTIAL/PLATELET
Basophils Absolute: 0.1 10*3/uL (ref 0.0–0.2)
Basos: 1 %
EOS (ABSOLUTE): 0 10*3/uL (ref 0.0–0.4)
Eos: 0 %
Hematocrit: 35.8 % (ref 34.0–46.6)
Hemoglobin: 11.7 g/dL (ref 11.1–15.9)
Immature Grans (Abs): 0 10*3/uL (ref 0.0–0.1)
Immature Granulocytes: 0 %
Lymphocytes Absolute: 1.9 10*3/uL (ref 0.7–3.1)
Lymphs: 24 %
MCH: 28.1 pg (ref 26.6–33.0)
MCHC: 32.7 g/dL (ref 31.5–35.7)
MCV: 86 fL (ref 79–97)
Monocytes Absolute: 0.5 10*3/uL (ref 0.1–0.9)
Monocytes: 6 %
Neutrophils Absolute: 5.5 10*3/uL (ref 1.4–7.0)
Neutrophils: 69 %
Platelets: 228 10*3/uL (ref 150–450)
RBC: 4.17 x10E6/uL (ref 3.77–5.28)
RDW: 12.7 % (ref 11.7–15.4)
WBC: 8 10*3/uL (ref 3.4–10.8)

## 2024-01-05 NOTE — Telephone Encounter (Signed)
Pharmacy Patient Advocate Encounter  Received notification from [facility] that Prior Authorization for synthroid has been APPROVED through 12/31/2024   PA #/Case ID/Reference #: [id number]

## 2024-01-08 NOTE — Telephone Encounter (Signed)
Patient was called and a message was left letting her know that she had been approved and should be able to go to her pharmacy and get her medication.

## 2024-04-08 IMAGING — DX DG SHOULDER 2+V*L*
3 series · 3 of 3 positions shown · non-contrast
Comparison: None Available.

CLINICAL DATA: Left shoulder pain.  Fall

EXAM:
LEFT SHOULDER - 2+ VIEW

[shoulder grashey]
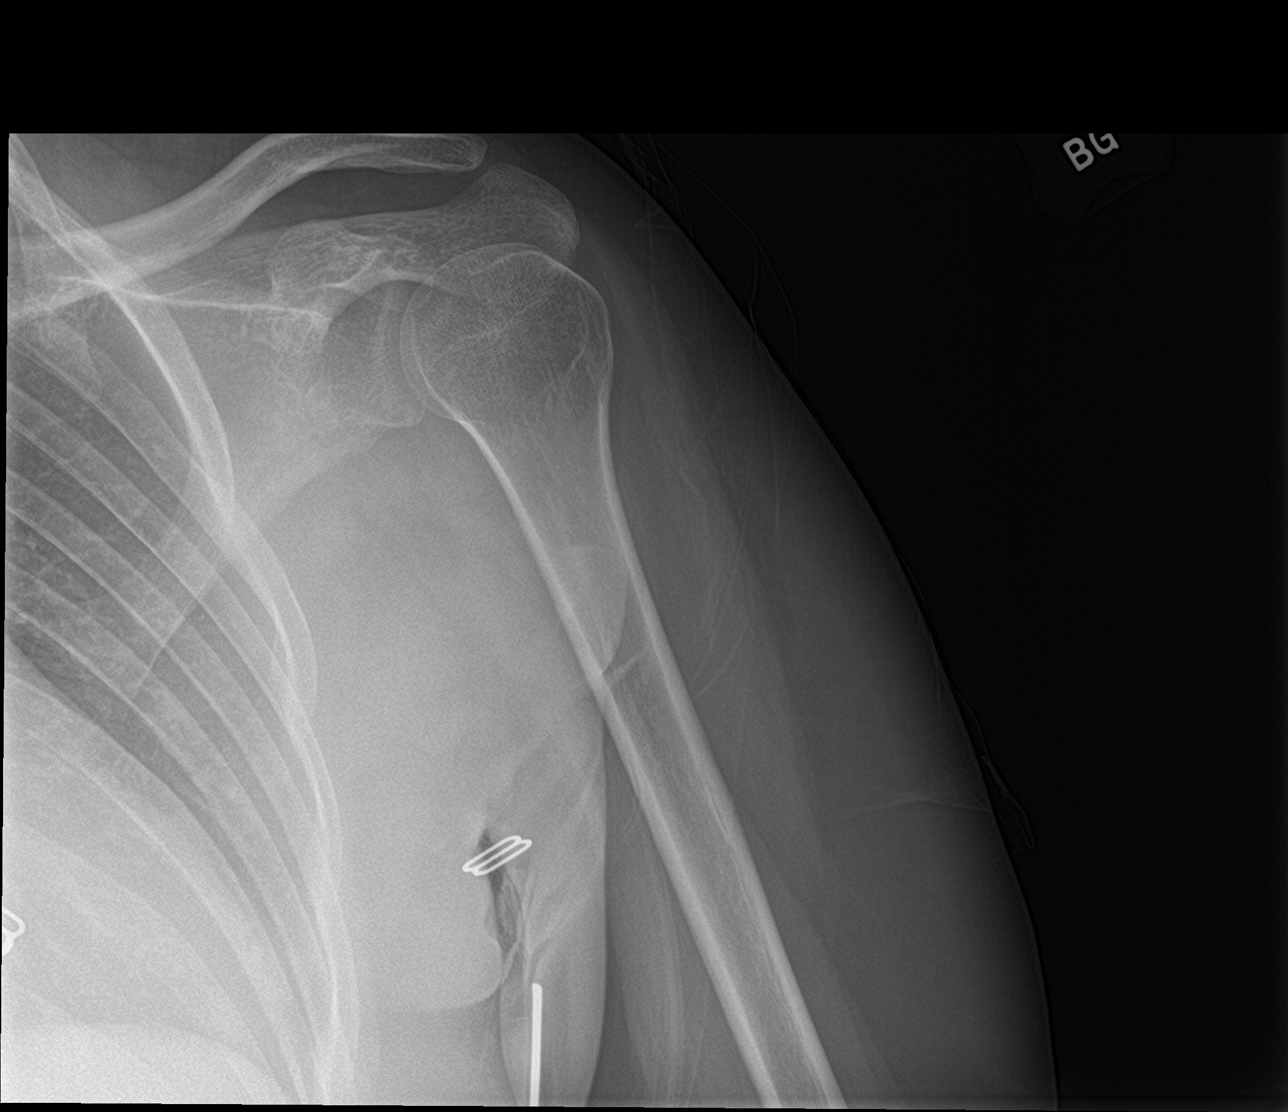

[shoulder y view]
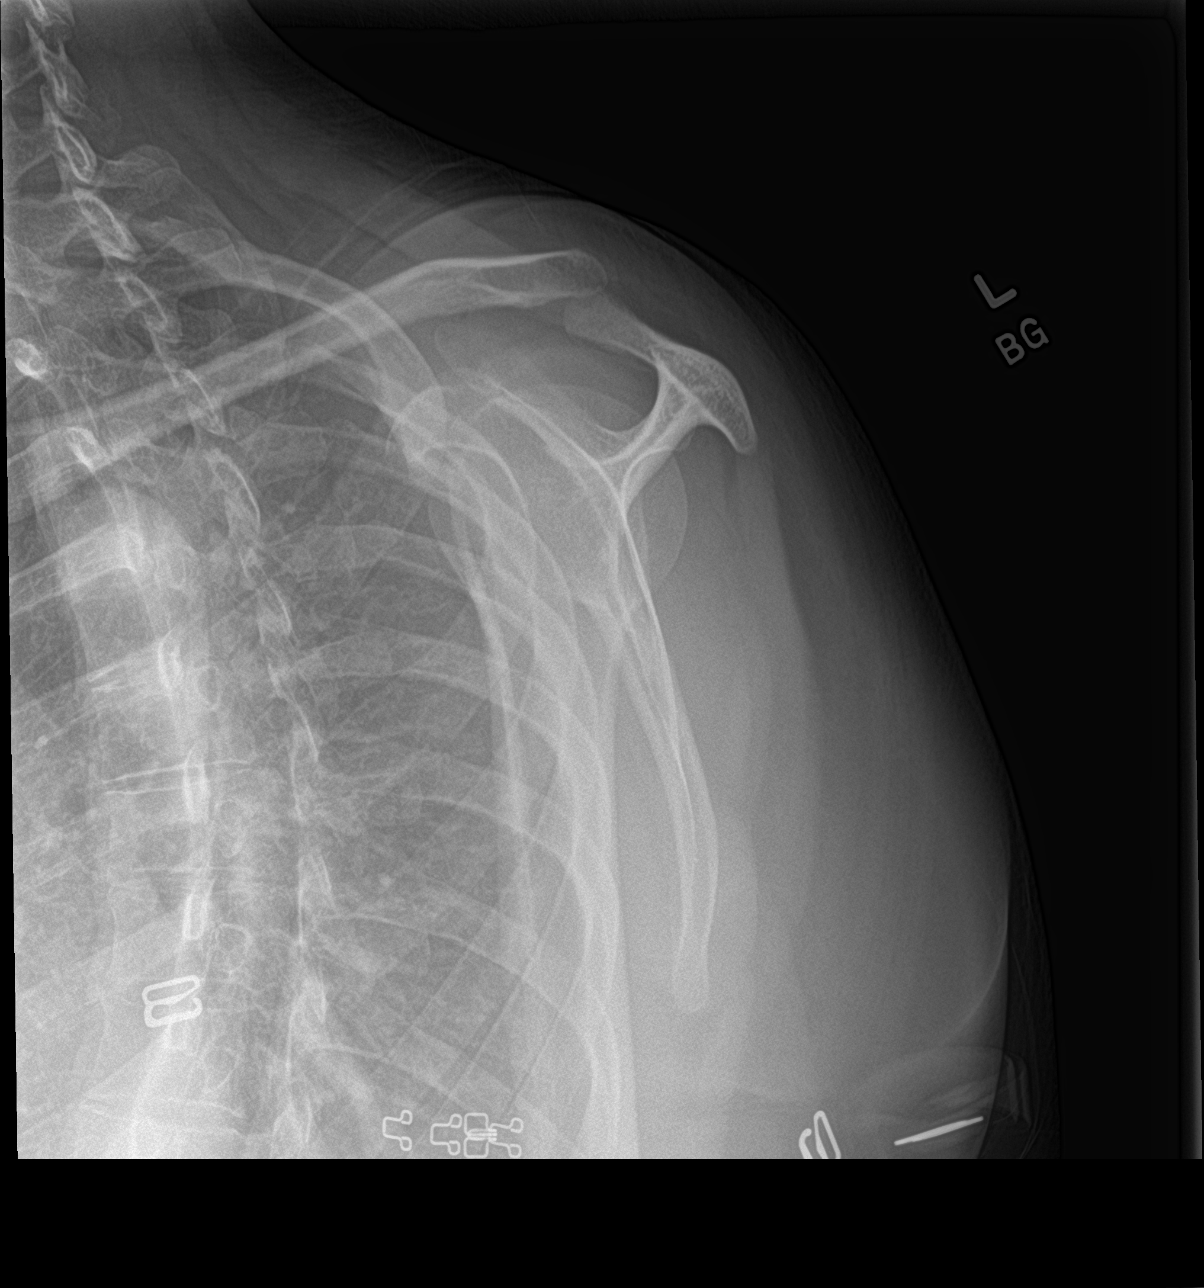

[shoulder axillary]
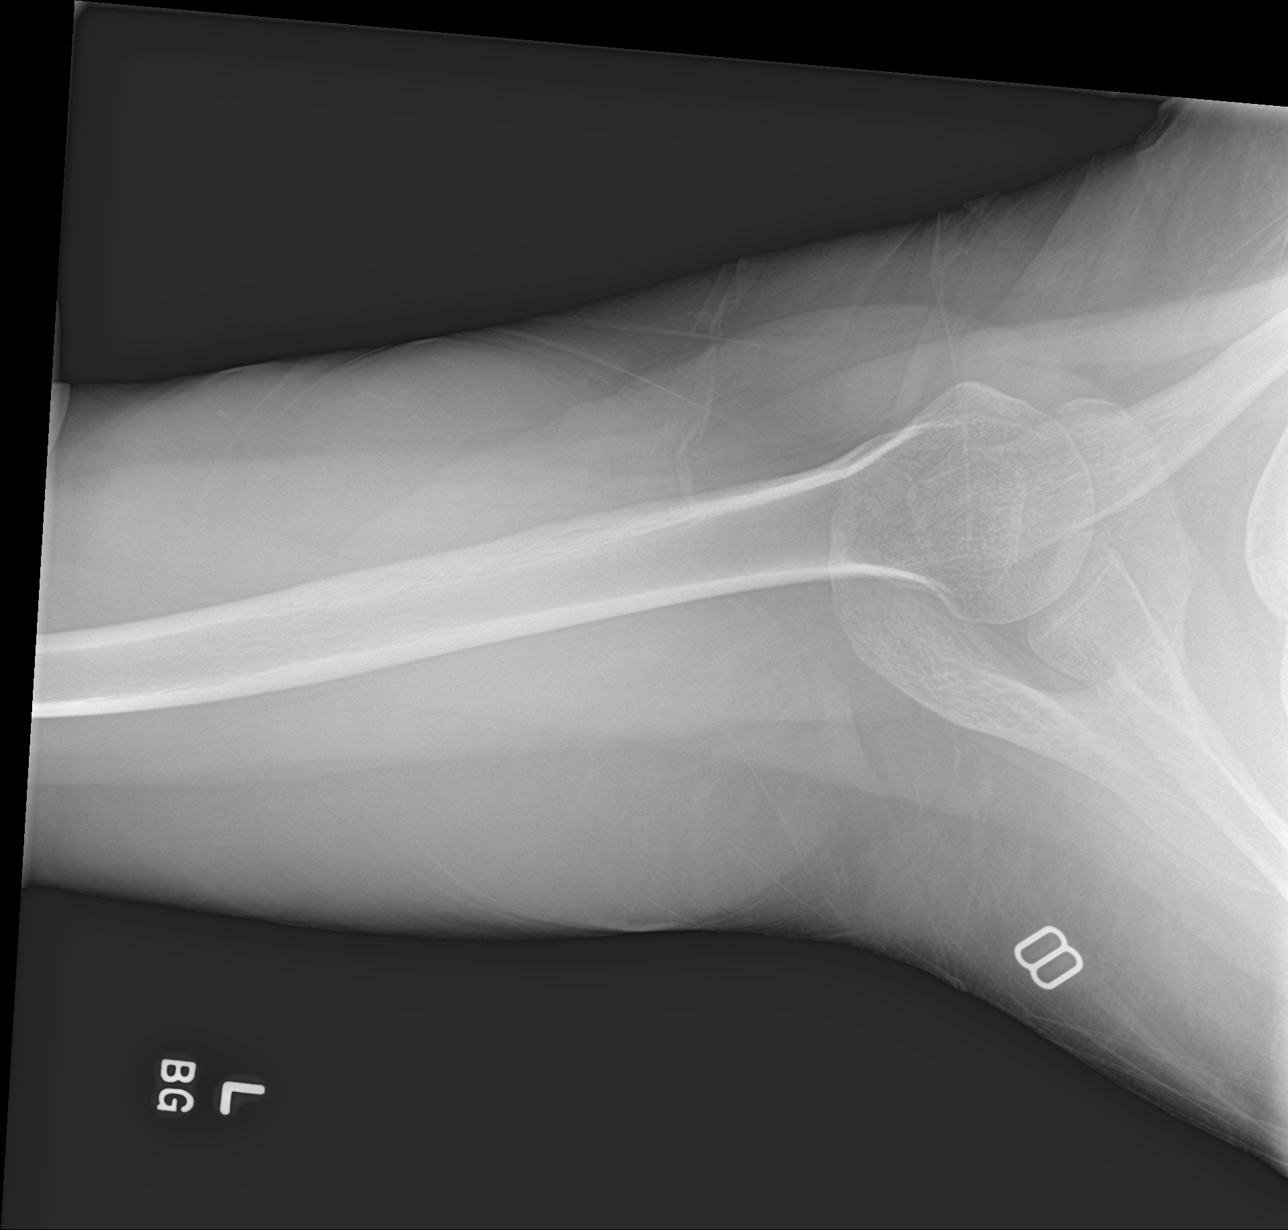

[3 of 3 positions shown; findings below may reference images not displayed]

FINDINGS: There is no evidence of fracture or dislocation. There is no
evidence of arthropathy or other focal bone abnormality. Soft
tissues are unremarkable.
IMPRESSION: Negative.

## 2024-05-08 ENCOUNTER — Telehealth: Payer: Self-pay | Admitting: Nurse Practitioner

## 2024-05-08 NOTE — Telephone Encounter (Signed)
 She can go ahead and repeat her labs now and I can reach out with the results via Mychart.  I have no sooner availability with appts right now.

## 2024-05-08 NOTE — Telephone Encounter (Signed)
 Pt would like to be seen sooner due to thinking her levels are off.

## 2024-05-08 NOTE — Telephone Encounter (Signed)
 LVM for pt with Whitney's recommendations.

## 2024-06-27 ENCOUNTER — Ambulatory Visit: Payer: No Typology Code available for payment source | Admitting: Nurse Practitioner

## 2024-07-03 ENCOUNTER — Other Ambulatory Visit: Payer: Self-pay | Admitting: Nurse Practitioner

## 2024-07-09 ENCOUNTER — Ambulatory Visit: Admitting: Nurse Practitioner

## 2024-07-23 ENCOUNTER — Other Ambulatory Visit: Payer: Self-pay | Admitting: Nurse Practitioner

## 2024-08-21 ENCOUNTER — Other Ambulatory Visit: Payer: Self-pay | Admitting: Nurse Practitioner

## 2024-08-22 NOTE — Telephone Encounter (Signed)
 We can give a limited refill but make her aware that she needs OV for future refills

## 2024-09-03 ENCOUNTER — Encounter: Payer: Self-pay | Admitting: Emergency Medicine

## 2024-09-03 ENCOUNTER — Ambulatory Visit: Admission: EM | Admit: 2024-09-03 | Discharge: 2024-09-03 | Disposition: A | Discharge status: Home / Self Care

## 2024-09-03 DIAGNOSIS — H6992 Unspecified Eustachian tube disorder, left ear: Secondary | ICD-10-CM

## 2024-09-03 DIAGNOSIS — H6592 Unspecified nonsuppurative otitis media, left ear: Secondary | ICD-10-CM | POA: Diagnosis not present

## 2024-09-03 MED ORDER — METHYLPREDNISOLONE 4 MG PO TBPK: ORAL_TABLET | ORAL | 0 refills | Status: AC

## 2024-09-03 MED ORDER — MECLIZINE HCL 12.5 MG PO TABS: 12.5000 mg | ORAL_TABLET | Freq: Three times a day (TID) | ORAL | 0 refills | Status: AC | PRN

## 2024-09-03 NOTE — Discharge Instructions (Addendum)
 Take medication as prescribed. Continue over-the-counter antihistamine such as Zyrtec, Claritin, or Allegra and Flonase daily while symptoms persist.  You may also take over-the-counter Sudafed as needed.   Warm compresses to the affected ear help with comfort. Do not stick anything inside the ear while symptoms persist. Avoid getting water inside of the ear while symptoms persist. If symptoms fail to improve with this treatment, please follow-up with your primary care physician or with ENT for further evaluation. Follow-up as needed.

## 2024-09-03 NOTE — ED Provider Notes (Signed)
 [facility]    CSN: [id number] Arrival date & time: 09/03/24  1357      History   Chief Complaint No chief complaint on file.   HPI Paula Holden is a 28 y.o. female.   The history is provided by the patient.   Patient presents for complaints of left ear pain that has been present for the past week. She states that the pain in the left ear is causing the left side of her face to her.. Patient denies fever, chills, nasal congestion, runny nose, headache, decreased hearing, sore throat, or cough.  She also endorses intermittent dizziness with nausea.  States dizziness worsens when she turns a certain way or if she moves too fast.  Patient states she has taken ibuprofen  for her symptoms.  Patient denies history of recurrent ear infections.  Patient reports prior history of the same or similar symptoms.    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      Home Medications    Prior to Admission medications   Medication Sig Start Date End Date Taking? Authorizing Provider  Atogepant (QULIPTA) 10 MG TABS Take by mouth.   Yes [provider]  albuterol (VENTOLIN HFA) 108 (90 Base) MCG/ACT inhaler Inhale 2 puffs into the lungs every 4 (four) hours as needed. 08/03/23   [provider]  buPROPion (WELLBUTRIN SR) 200 MG 12 hr tablet Take 200 mg by mouth daily. 09/10/21   [provider]  Butalbital-APAP-Caffeine 50-300-40 MG CAPS Take by mouth as needed.    [provider]  clonazePAM (KLONOPIN) 0.5 MG disintegrating  tablet Take 0.5 mg by mouth as needed. 05/01/23   [provider]  Cyanocobalamin (B-12) 1000 MCG TBCR Take 1 tablet by mouth daily.    [provider]  cyclobenzaprine  (FLEXERIL ) 5 MG tablet Take 1 tablet (5 mg total) by mouth 3 (three) times daily as needed. Patient taking differently: Take 5 mg by mouth 3 (three) times daily as needed for muscle spasms. 09/05/22   Margrette Taft BRAVO, MD  ferrous sulfate 325 (65 FE) MG tablet Take 325 mg by mouth daily. Patient not taking: Reported on 12/29/2023 03/24/23   [provider]  fluticasone (FLONASE) 50 MCG/ACT nasal spray Place 2 sprays into both nostrils daily. 11/12/20   [provider]  gabapentin (NEURONTIN) 100 MG capsule Take 100 mg by mouth 3 (three) times daily as needed. Patient not taking: Reported on 01/01/2024 07/10/23   [provider]  ipratropium (ATROVENT ) 0.03 % nasal spray Place 2 sprays into both nostrils every 12 (twelve) hours. 07/24/23   Leath-Warren, Etta PARAS, NP  nabumetone  (RELAFEN ) 500 MG tablet Take 1 tablet (500 mg total) by mouth 2 (two) times daily. 09/05/22   Margrette Taft BRAVO, MD  naproxen  (NAPROSYN ) 500 MG tablet Take 1 tablet (500 mg total) by mouth 2 (two) times daily. 04/08/23   Zammit, Joseph, MD  ondansetron  (ZOFRAN -ODT) 4 MG disintegrating tablet Take 1 tablet (4 mg total) by mouth every 8 (eight) hours as needed  for nausea or vomiting. 01/05/23   Stuart Vernell Norris, PA-C  promethazine -dextromethorphan (PROMETHAZINE -DM) 6.25-15 MG/5ML syrup Take 5 mLs by mouth at bedtime as needed for cough. 02/07/23   Chandra Harlene LABOR, NP  SYNTHROID  100 MCG tablet TAKE 1 TABLET BY MOUTH DAILY BEFORE BREAKFAST. 08/23/24   Therisa Benton PARAS, NP  TRI-ESTARYLLA 0.18/0.215/0.25 MG-35 MCG tablet Take 1 tablet by mouth daily. 07/23/21   [provider]  Vitamin D, Ergocalciferol, (DRISDOL) 1.25 MG (50000 UNIT) CAPS capsule Take 50,000 Units by mouth once a week. 12/03/23   [provider]    Family History Family History  Problem Relation Age of Onset   Hyperlipidemia Mother    Thyroid  disease Maternal Grandmother     Social History Social History   Tobacco Use   Smoking status: Never   Smokeless tobacco: Never  Vaping Use   Vaping status: Never Used  Substance Use Topics   Alcohol use: Yes    Comment: Social   Drug use: Never     Allergies   Augmentin [amoxicillin-pot clavulanate] and Prednisone    Review of Systems Review of Systems Per HPI  Physical Exam Triage Vital Signs ED Triage Vitals  Encounter Vitals Group     BP 09/03/24 1415 137/87     Girls Systolic BP Percentile --      Girls Diastolic BP Percentile --      Boys Systolic BP Percentile --      Boys Diastolic BP Percentile --      Pulse Rate 09/03/24 1415 83     Resp 09/03/24 1415 18     Temp 09/03/24 1415 98.1 F (36.7 C)     Temp Source 09/03/24 1415 Oral     SpO2 09/03/24 1415 98 %     Weight --      Height --      Head Circumference --      Peak Flow --      Pain Score 09/03/24 1416 5     Pain Loc --      Pain Education --      Exclude from Growth Chart --    No data found.  Updated Vital Signs BP 137/87 (BP Location: Right Arm)   Pulse 83   Temp 98.1 F (36.7 C) (Oral)   Resp 18   LMP 07/09/2024 (Approximate) Comment: started new birthcontol  SpO2 98%   Visual Acuity Right Eye Distance:   Left Eye Distance:   Bilateral Distance:    Right Eye Near:   Left Eye Near:    Bilateral Near:     Physical Exam Vitals and nursing note reviewed.  Constitutional:      General: She is not in acute distress.    Appearance: Normal appearance.  HENT:     Head: Normocephalic.     Right Ear: Tympanic membrane, ear canal and external ear normal.     Left Ear: Ear canal and external ear normal. A middle ear effusion is present. Tympanic membrane is not erythematous.     Nose: Nose normal.     Mouth/Throat:     Mouth: Mucous membranes are moist.  Eyes:      Extraocular Movements: Extraocular movements intact.     Conjunctiva/sclera: Conjunctivae normal.     Pupils: Pupils are equal, round, and reactive to light.  Cardiovascular:     Rate and Rhythm: Normal rate and regular rhythm.     Pulses: Normal pulses.     Heart sounds: Normal heart  sounds.  Pulmonary:     Effort: Pulmonary effort is normal.     Breath sounds: Normal breath sounds.  Musculoskeletal:     Cervical back: Normal range of motion.  Skin:    General: Skin is warm and dry.  Neurological:     General: No focal deficit present.     Mental Status: She is alert and oriented to person, place, and time.  Psychiatric:        Mood and Affect: Mood normal.        Behavior: Behavior normal.      UC Treatments / Results  Labs (all labs ordered are listed, but only abnormal results are displayed) Labs Reviewed - No data to display  EKG   Radiology No results found.  Procedures Procedures (including critical care time)  Medications Ordered in UC Medications - No data to display  Initial Impression / Assessment and Plan / UC Course  I have reviewed the triage vital signs and the nursing notes.  Pertinent labs & imaging results that were available during my care of the patient were reviewed by me and considered in my medical decision making (see chart for details).  On exam, patient with a left middle ear effusion.  There is no bulging or erythema of the left TM.  She does not have any sinus pressure or tenderness noted on exam, will rule out acute sinusitis. Will treat symptomatically with a Medrol  Dosepak and meclizine  12.5 mg for dizziness.  Patient advised to continue use of allergy medication and Flonase. Supportive care recommendations were provided and discussed with the patient to include continuing over-the-counter analgesics, warm compresses to the affected ear, and avoidance of entrance of water inside of the ear while symptoms persist. Patient was advised  that if symptoms do not improve with this treatment, she will need to follow-up with ENT for further evaluation. Patient is in agreement with this plan of care and verbalizes understanding. All questions were answered. Patient stable for discharge. Work note was provided.    Final Clinical Impressions(s) / UC Diagnoses   Final diagnoses:  None   Discharge Instructions   None    ED Prescriptions   None    PDMP not reviewed this encounter.   Gilmer Etta PARAS, NP 09/03/24 1438

## 2024-09-03 NOTE — ED Triage Notes (Signed)
 Left ear pain since last week.  States she feels nauseated and dizzy at times.  States left side of face and mouth hurts

## 2024-09-20 ENCOUNTER — Other Ambulatory Visit: Payer: Self-pay | Admitting: Nurse Practitioner

## 2024-10-17 ENCOUNTER — Other Ambulatory Visit: Payer: Self-pay | Admitting: Nurse Practitioner

## 2024-11-12 ENCOUNTER — Other Ambulatory Visit: Payer: Self-pay | Admitting: Nurse Practitioner

## 2024-11-18 ENCOUNTER — Telehealth: Payer: Self-pay | Admitting: Nurse Practitioner

## 2024-11-18 NOTE — Telephone Encounter (Signed)
 I saw labs in [facility] from 12/3.  They were normal with the exception for T3 uptake ratio (which is insignificant when on meds).  She does not need to change her dose of medications.  She does need to schedule follow up though.  Will need repeat labs TSH and Free T4 prior to that visit as well (as I am assuming it will be a few months for her to get in).

## 2024-11-18 NOTE — Telephone Encounter (Signed)
 Patient said she did her labs with her PCP and her labs are out of range. She said she needs an appt - I asked her to have those sent to us . Let me know what we need to do when we get them

## 2024-12-14 ENCOUNTER — Other Ambulatory Visit: Payer: Self-pay | Admitting: Nurse Practitioner

## 2025-02-21 ENCOUNTER — Ambulatory Visit: Admitting: Nurse Practitioner
